# Patient Record
Sex: Male | Born: 1970
Health system: Southern US, Community
[De-identification: ages and names within clinical notes are randomized; demographics above are authoritative.]

## PROBLEM LIST (undated history)

## (undated) DIAGNOSIS — R350 Frequency of micturition: Secondary | ICD-10-CM

## (undated) DIAGNOSIS — I341 Nonrheumatic mitral (valve) prolapse: Secondary | ICD-10-CM

## (undated) DIAGNOSIS — K219 Gastro-esophageal reflux disease without esophagitis: Secondary | ICD-10-CM

## (undated) DIAGNOSIS — K2270 Barrett's esophagus without dysplasia: Secondary | ICD-10-CM

## (undated) DIAGNOSIS — N400 Enlarged prostate without lower urinary tract symptoms: Secondary | ICD-10-CM

## (undated) DIAGNOSIS — R351 Nocturia: Secondary | ICD-10-CM

## (undated) HISTORY — DX: Gastro-esophageal reflux disease without esophagitis: K21.9

## (undated) HISTORY — PX: CHOLECYSTECTOMY: SHX55

## (undated) HISTORY — DX: Benign prostatic hyperplasia without lower urinary tract symptoms: N40.0

## (undated) HISTORY — DX: Frequency of micturition: R35.0

## (undated) HISTORY — DX: Nocturia: R35.1

## (undated) HISTORY — DX: Nonrheumatic mitral (valve) prolapse: I34.1

## (undated) HISTORY — DX: Barrett's esophagus without dysplasia: K22.70

---

## 2004-01-07 ENCOUNTER — Ambulatory Visit: Payer: Self-pay | Admitting: Gastroenterology

## 2004-03-26 ENCOUNTER — Ambulatory Visit: Payer: Self-pay | Admitting: Gastroenterology

## 2004-11-23 ENCOUNTER — Ambulatory Visit: Payer: Self-pay | Admitting: Gastroenterology

## 2006-06-08 ENCOUNTER — Other Ambulatory Visit: Payer: Self-pay

## 2006-06-08 ENCOUNTER — Emergency Department: Payer: Self-pay | Admitting: Unknown Physician Specialty

## 2007-08-24 ENCOUNTER — Ambulatory Visit: Payer: Self-pay | Admitting: Gastroenterology

## 2015-01-07 ENCOUNTER — Encounter: Payer: Self-pay | Admitting: *Deleted

## 2015-01-08 ENCOUNTER — Encounter: Payer: Self-pay | Admitting: Urology

## 2015-01-08 ENCOUNTER — Ambulatory Visit (INDEPENDENT_AMBULATORY_CARE_PROVIDER_SITE_OTHER): Payer: BLUE CROSS/BLUE SHIELD | Admitting: Urology

## 2015-01-08 VITALS — BP 151/79 | HR 83 | Ht 73.0 in | Wt 196.4 lb

## 2015-01-08 DIAGNOSIS — R1909 Other intra-abdominal and pelvic swelling, mass and lump: Secondary | ICD-10-CM | POA: Diagnosis not present

## 2015-01-08 NOTE — Progress Notes (Signed)
01/08/2015 10:28 AM   Jordan Doyle September 06, 1970 [id number]  Referring provider: No referring provider defined for this encounter.  Chief Complaint  Patient presents with  . Lump in groin    HPI: Patient is a 44 year old white male who discovered a lump in his right groin area 1 week ago after carrying down a log home. On the initial discovery was painful. He did not have any change in bowel movements.  He has not had any difficulty urinating. He is sexually active, but he is in a monogamous relationship. He has not had any penile discharge. There has been no fluid collection in the groin mass. He states there has not been any fever or redness with the mass.    Today, he states the lump is not painful. He has not had fevers, chills, nausea or vomiting. He is not having difficulty with urination or any gross hematuria. He also has not had any difficulty with bowel movements or blood in the stool.   PMH: Past Medical History  Diagnosis Date  . Barrett esophagus   . Urinary frequency   . BPH (benign prostatic hyperplasia)   . Nocturia     Surgical History: Past Surgical History  Procedure Laterality Date  . Cholecystectomy      Home Medications:    Medication List       This list is accurate as of: 01/08/15 10:28 AM.  Always use your most recent med list.               lansoprazole 30 MG capsule  Commonly known as:  PREVACID  Take by mouth.        Allergies: No Known Allergies  Family History: Family History  Problem Relation Age of Onset  . Kidney disease Neg Hx   . Prostate cancer Neg Hx     Social History:  reports that he has quit smoking. He does not have any smokeless tobacco history on file. He reports that he drinks alcohol. He reports that he does not use illicit drugs.  ROS: UROLOGY Frequent Urination?: No Hard to postpone urination?: No Burning/pain with urination?: No Get up at night to urinate?: No Leakage of urine?: No Urine stream  starts and stops?: No Trouble starting stream?: No Do you have to strain to urinate?: No Blood in urine?: No Urinary tract infection?: No Sexually transmitted disease?: No Injury to kidneys or bladder?: No Painful intercourse?: No Weak stream?: No Erection problems?: No Penile pain?: No  Gastrointestinal Nausea?: No Vomiting?: No Indigestion/heartburn?: No Diarrhea?: No Constipation?: No  Constitutional Fever: No Night sweats?: No Weight loss?: No Fatigue?: No  Skin Skin rash/lesions?: No Itching?: No  Eyes Blurred vision?: No Double vision?: No  Ears/Nose/Throat Sore throat?: No Sinus problems?: No  Hematologic/Lymphatic Swollen glands?: Yes (?) Easy bruising?: No  Cardiovascular Leg swelling?: No Chest pain?: No  Respiratory Cough?: No Shortness of breath?: No  Endocrine Excessive thirst?: No  Musculoskeletal Back pain?: No Joint pain?: No  Neurological Headaches?: No Dizziness?: No  Psychologic Depression?: No Anxiety?: No  Physical Exam: BP 151/79 mmHg  Pulse 83  Ht  (1.854 m)  Wt 196 lb 6.4 oz (89.086 kg)  BMI 25.92 kg/m2  GU: No CVA tenderness.  No bladder fullness or masses.  Patient with circumcised phallus.   Urethral meatus is patent.  No penile discharge. No penile lesions or rashes. Scrotum without lesions, cysts, rashes and/or edema.  Testicles are located scrotally bilaterally. No masses are appreciated in  the testicles. Left and right epididymis are normal.  A firm nonpainful lump located in the left groin area (10 mm x 40 mm)  Laboratory Data: PSA history  0.9 ng/mL on 03/11/2014   Assessment & Plan:    1. Right groin mass:   Patient with a firm non painful lump in the left groin area.  Worrisome for malignancy.  We will obtain a CT scan of the abdomen and pelvis with contrast.  He will return for the report.    Return for CT scan report.  Michiel CowboySHANNON Rithwik Schmieg, PA-C  [facility] [address], [address] [place], [place] [id number] [phone]

## 2015-01-13 ENCOUNTER — Telehealth: Payer: Self-pay | Admitting: Radiology

## 2015-01-13 ENCOUNTER — Ambulatory Visit (INDEPENDENT_AMBULATORY_CARE_PROVIDER_SITE_OTHER): Payer: BLUE CROSS/BLUE SHIELD | Admitting: Urology

## 2015-01-13 ENCOUNTER — Encounter (HOSPITAL_COMMUNITY): Payer: Self-pay

## 2015-01-13 ENCOUNTER — Encounter: Payer: Self-pay | Admitting: Urology

## 2015-01-13 ENCOUNTER — Ambulatory Visit (HOSPITAL_COMMUNITY)
Admission: RE | Admit: 2015-01-13 | Discharge: 2015-01-13 | Disposition: A | Discharge status: Home / Self Care | Payer: BLUE CROSS/BLUE SHIELD | Source: Ambulatory Visit | Attending: Urology | Admitting: Urology

## 2015-01-13 VITALS — BP 116/71 | HR 80 | Ht 76.0 in | Wt 192.0 lb

## 2015-01-13 DIAGNOSIS — R1909 Other intra-abdominal and pelvic swelling, mass and lump: Secondary | ICD-10-CM

## 2015-01-13 DIAGNOSIS — R591 Generalized enlarged lymph nodes: Secondary | ICD-10-CM | POA: Diagnosis not present

## 2015-01-13 DIAGNOSIS — K439 Ventral hernia without obstruction or gangrene: Secondary | ICD-10-CM | POA: Diagnosis not present

## 2015-01-13 DIAGNOSIS — R590 Localized enlarged lymph nodes: Secondary | ICD-10-CM | POA: Diagnosis not present

## 2015-01-13 MED ORDER — IOHEXOL 300 MG/ML  SOLN
100.0000 mL | Freq: Once | INTRAMUSCULAR | Status: AC | PRN
  Administered 2015-01-13: 100 mL via INTRAVENOUS

## 2015-01-13 NOTE — Telephone Encounter (Signed)
Appt made for 01/13/15 @4 :00. Pt aware.

## 2015-01-13 NOTE — Progress Notes (Signed)
4:51 PM   ETAI COPADO May 07, 1970 [id number]  Referring provider: No referring provider defined for this encounter.  Chief Complaint  Patient presents with  . Results    CT    HPI: Patient is a 44 year old white male who presents today with his wife for his CT scan results.  CT scan was performed for further evaluation of a right groin mass.    Background story Patient is a 44 year old white male who discovered a lump in his right groin area 1 week ago after tearing down a log home. On the initial discovery, the area was painful. He wasn't having any changes in bowel movements.  He did not  any difficulty urinating. He is sexually active, but he is in a monogamous relationship. He did not have any penile discharge. There has been no fluid collection in the groin mass. He states there has not been any fever or redness with the mass.    Today, he states the lump is not painful. He has not had fevers, chills, nausea or vomiting. He is not having difficulty with urination or any gross hematuria. He also has not had any difficulty with bowel movements or blood in the stool.    CT scan noted two enlarged lymph nodes in the right inguinal region.  There are scattered sub centimeter lymph nodes elsewhere in each inguinal region as well as in the mesenteric and retroperitoneal regions.  I  have reviewed the films with the patient and his wife.   PMH: Past Medical History  Diagnosis Date  . Barrett esophagus   . Urinary frequency   . BPH (benign prostatic hyperplasia)   . Nocturia     Surgical History: Past Surgical History  Procedure Laterality Date  . Cholecystectomy      Home Medications:    Medication List       This list is accurate as of: 01/13/15  4:51 PM.  Always use your most recent med list.               lansoprazole 30 MG capsule  Commonly known as:  PREVACID  Take by mouth.        Allergies: No Known Allergies  Family History: Family History    Problem Relation Age of Onset  . Kidney disease Neg Hx   . Prostate cancer Neg Hx     Social History:  reports that he has quit smoking. He does not have any smokeless tobacco history on file. He reports that he drinks alcohol. He reports that he does not use illicit drugs.  ROS: UROLOGY Frequent Urination?: No Hard to postpone urination?: No Burning/pain with urination?: No Get up at night to urinate?: No Leakage of urine?: No Urine stream starts and stops?: No Trouble starting stream?: No Do you have to strain to urinate?: No Blood in urine?: No Urinary tract infection?: No Sexually transmitted disease?: No Injury to kidneys or bladder?: No Painful intercourse?: No Weak stream?: No Erection problems?: No Penile pain?: No  Gastrointestinal Nausea?: No Vomiting?: No Indigestion/heartburn?: No Diarrhea?: No Constipation?: No  Constitutional Fever: No Night sweats?: No Weight loss?: No Fatigue?: No  Skin Skin rash/lesions?: No Itching?: No  Eyes Blurred vision?: No Double vision?: No  Ears/Nose/Throat Sore throat?: No Sinus problems?: No  Hematologic/Lymphatic Swollen glands?: Yes Easy bruising?: No  Cardiovascular Leg swelling?: No Chest pain?: No  Respiratory Cough?: No Shortness of breath?: No  Endocrine Excessive thirst?: No  Musculoskeletal Back pain?: No  Joint pain?: No  Neurological Headaches?: No Dizziness?: No  Psychologic Depression?: No Anxiety?: No  Pertinent imaging CLINICAL DATA: Palpable fullness right inguinal region  EXAM: CT ABDOMEN AND PELVIS WITH CONTRAST  TECHNIQUE: Multidetector CT imaging of the abdomen and pelvis was performed using the standard protocol following bolus administration of intravenous contrast. Oral contrast was also administered.  CONTRAST: 100mL OMNIPAQUE IOHEXOL 300 MG/ML SOLN  COMPARISON: None.  FINDINGS: Lower chest: Lung bases are clear.  Hepatobiliary: No focal  liver lesions are identified. The gallbladder is absent. There is no biliary duct dilatation.  Pancreas: There is no demonstrable pancreatic mass or inflammatory focus.  Spleen: No splenic lesions are identified.  Adrenals/Urinary Tract: No adrenal lesions are identified. There is scarring in the upper pole of the left kidney. There is no renal mass or hydronephrosis on either side. There is no renal or ureteral calculus on either side. The urinary bladder is contracted.  Stomach/Bowel: There is no bowel wall or mesenteric thickening. No bowel obstruction. No free air or portal venous air.  Vascular/Lymphatic: There are enlarged lymph nodes in the right inguinal region. There is a right inguinal lymph node measuring 2.6 x 1.9 cm. A slightly more medial lymph node in the right inguinal region measures 2.5 x 2.1 cm. There is stranding in the immediately surrounding abdominal wall fat in the right anterior inguinal region without similar stranding elsewhere. There are subcentimeter lymph nodes elsewhere in each inguinal region. Elsewhere, there is no appreciable adenopathy in the abdomen or pelvis. There are a few tiny mesenteric retroperitoneal and mesenteric lymph nodes which do not meet size criteria for pathologic significance. There is atherosclerotic calcification in the distal aorta and common iliac arteries. There is no abdominal aortic aneurysm. The major mesenteric arteries are patent.  Reproductive: Prostate and seminal vesicles appear normal.  Other: Appendix appears normal. There is no ascites or abscess in the abdomen or pelvis. No inguinal hernias are apparent. There is a rather minimal ventral hernia at the level of the umbilicus containing only fat.  Musculoskeletal: There is mild degenerative change in the lumbar spine. There are no blastic or lytic bone lesions.  IMPRESSION: There are two enlarged lymph nodes in the right inguinal region. There are  scattered subcentimeter lymph nodes elsewhere in each inguinal region as well as in the mesenteric and retroperitoneal regions. No other lymph node enlargement is appreciable in the abdomen or pelvis. The etiology for this localized right inguinal adenopathy is uncertain. There is mild thickening of the fat adjacent to the inguinal lymph nodes in the right inguinal wall anteriorly. Both inflammatory and neoplastic etiologies could present in this manner.  There is a rather minimal ventral hernia containing only fat.  Study otherwise unremarkable. No liver lesions. Urinary bladder is contracted. This degree of contraction precludes exclusion of potential wall thickness in this area.  There is no bowel wall or mesenteric thickening within the peritoneal or retroperitoneum. No bowel obstruction. Appendix appears normal. No abscess. No renal or ureteral calculus. No hydronephrosis.   Electronically Signed  By: Bretta BangWilliam Woodruff III M.D.  On: 01/13/2015 09:06       Physical Exam: BP 116/71 mmHg  Pulse 80  Ht 6\' 4"  (1.93 m)  Wt 192 lb (87.091 kg)  BMI 23.38 kg/m2  GU: No CVA tenderness.  No bladder fullness or masses.  Patient with circumcised phallus.   Urethral meatus is patent.  No penile discharge. No penile lesions or rashes. Scrotum without lesions, cysts, rashes and/or edema.  Testicles are located scrotally bilaterally. No masses are appreciated in the testicles. Left and right epididymis are normal.  A firm nonpainful lump located in the left groin area (10 mm x 40 mm)  Laboratory Data: PSA history  0.9 ng/mL on 03/11/2014   Assessment & Plan:    1. Right groin mass:   Patient's groin mass is the result of enlarged lymph nodes in the inguinal region not an inguinal hernia.    2. Lymphadenopathy:   CT scan completed on 01/13/2015 noted two enlarged lymph nodes in the right inguinal region with scattered sub centimeter lymph nodes in each inguinal as well as in  the mesenteric and retroperitoneal regions.  I have explained to the patient and his wife that we cannot be certain if this is an inflammatory process or a malignant one.  I will send his urine for culture and GC/Chlamydia cultures.  We will also refer to the Cancer [facility] for further evaluation.    Return for refer to the Cancer center.  Michiel Cowboy, PA-C  [facility] [address] [place], [place] [phone]

## 2015-01-13 NOTE — Telephone Encounter (Signed)
-----   Message from Harle BattiestShannon A McGowan, PA-C sent at 01/13/2015  1:28 PM EST ----- Patient's appointment for his CT results is not until next week.  Would we be able to move him up to this week?

## 2015-01-14 DIAGNOSIS — R591 Generalized enlarged lymph nodes: Secondary | ICD-10-CM | POA: Insufficient documentation

## 2015-01-14 LAB — URINALYSIS, COMPLETE
BILIRUBIN UA: NEGATIVE
GLUCOSE, UA: NEGATIVE
KETONES UA: NEGATIVE
LEUKOCYTES UA: NEGATIVE
Nitrite, UA: NEGATIVE
PROTEIN UA: NEGATIVE
RBC UA: NEGATIVE
SPEC GRAV UA: 1.01 (ref 1.005–1.030)
UUROB: 0.2 mg/dL (ref 0.2–1.0)
pH, UA: 7 (ref 5.0–7.5)

## 2015-01-14 LAB — MICROSCOPIC EXAMINATION
BACTERIA UA: NONE SEEN
Epithelial Cells (non renal): NONE SEEN /hpf (ref 0–10)
RBC MICROSCOPIC, UA: NONE SEEN /HPF (ref 0–?)

## 2015-01-15 ENCOUNTER — Telehealth: Payer: Self-pay

## 2015-01-15 ENCOUNTER — Encounter: Payer: Self-pay | Admitting: Urology

## 2015-01-15 LAB — CULTURE, URINE COMPREHENSIVE

## 2015-01-15 LAB — GC/CHLAMYDIA PROBE AMP
Chlamydia trachomatis, NAA: NEGATIVE
Neisseria gonorrhoeae by PCR: NEGATIVE

## 2015-01-15 NOTE — Telephone Encounter (Signed)
Patient has an appointment on 01/17/2015 at 1:30pm with the [facility].

## 2015-01-15 NOTE — Telephone Encounter (Signed)
Ninfa MeekerHey Shannon     I was just touching base to see if you we able to make an appointment .     Thanks again FriesJoey. Delene Ruffini(Samin)     This is a Wellsite geologistmychart message from pt.

## 2015-01-15 NOTE — Telephone Encounter (Signed)
Sent message to pt via mychart

## 2015-01-17 ENCOUNTER — Encounter: Payer: Self-pay | Admitting: Internal Medicine

## 2015-01-17 ENCOUNTER — Inpatient Hospital Stay: Payer: BLUE CROSS/BLUE SHIELD | Attending: Internal Medicine | Admitting: Internal Medicine

## 2015-01-17 ENCOUNTER — Inpatient Hospital Stay: Payer: BLUE CROSS/BLUE SHIELD

## 2015-01-17 VITALS — BP 109/75 | HR 63 | Temp 96.8°F | Resp 18 | Ht 73.0 in | Wt 190.7 lb

## 2015-01-17 DIAGNOSIS — I341 Nonrheumatic mitral (valve) prolapse: Secondary | ICD-10-CM | POA: Diagnosis not present

## 2015-01-17 DIAGNOSIS — K439 Ventral hernia without obstruction or gangrene: Secondary | ICD-10-CM

## 2015-01-17 DIAGNOSIS — R531 Weakness: Secondary | ICD-10-CM | POA: Diagnosis not present

## 2015-01-17 DIAGNOSIS — R351 Nocturia: Secondary | ICD-10-CM | POA: Insufficient documentation

## 2015-01-17 DIAGNOSIS — N401 Enlarged prostate with lower urinary tract symptoms: Secondary | ICD-10-CM | POA: Diagnosis not present

## 2015-01-17 DIAGNOSIS — R590 Localized enlarged lymph nodes: Secondary | ICD-10-CM | POA: Diagnosis not present

## 2015-01-17 DIAGNOSIS — Z87891 Personal history of nicotine dependence: Secondary | ICD-10-CM | POA: Diagnosis not present

## 2015-01-17 DIAGNOSIS — R591 Generalized enlarged lymph nodes: Secondary | ICD-10-CM

## 2015-01-17 DIAGNOSIS — K219 Gastro-esophageal reflux disease without esophagitis: Secondary | ICD-10-CM | POA: Diagnosis not present

## 2015-01-17 DIAGNOSIS — Z79899 Other long term (current) drug therapy: Secondary | ICD-10-CM | POA: Diagnosis not present

## 2015-01-17 LAB — CBC WITH DIFFERENTIAL/PLATELET
BASOS ABS: 0.1 10*3/uL (ref 0–0.1)
BASOS PCT: 1 %
EOS ABS: 0.1 10*3/uL (ref 0–0.7)
EOS PCT: 2 %
HCT: 45.7 % (ref 40.0–52.0)
Hemoglobin: 15.1 g/dL (ref 13.0–18.0)
LYMPHS PCT: 37 %
Lymphs Abs: 3.1 10*3/uL (ref 1.0–3.6)
MCH: 26.8 pg (ref 26.0–34.0)
MCHC: 32.9 g/dL (ref 32.0–36.0)
MCV: 81.2 fL (ref 80.0–100.0)
Monocytes Absolute: 0.7 10*3/uL (ref 0.2–1.0)
Monocytes Relative: 8 %
Neutro Abs: 4.3 10*3/uL (ref 1.4–6.5)
Neutrophils Relative %: 52 %
PLATELETS: 285 10*3/uL (ref 150–440)
RBC: 5.63 MIL/uL (ref 4.40–5.90)
RDW: 14.1 % (ref 11.5–14.5)
WBC: 8.2 10*3/uL (ref 3.8–10.6)

## 2015-01-17 LAB — COMPREHENSIVE METABOLIC PANEL
ALBUMIN: 4.5 g/dL (ref 3.5–5.0)
ALT: 13 U/L — AB (ref 17–63)
AST: 18 U/L (ref 15–41)
Alkaline Phosphatase: 48 U/L (ref 38–126)
Anion gap: 9 (ref 5–15)
BUN: 11 mg/dL (ref 6–20)
CHLORIDE: 101 mmol/L (ref 101–111)
CO2: 28 mmol/L (ref 22–32)
CREATININE: 0.97 mg/dL (ref 0.61–1.24)
Calcium: 9.3 mg/dL (ref 8.9–10.3)
GFR calc Af Amer: >60 mL/min (ref >60)
GFR calc non Af Amer: >60 mL/min (ref >60)
GLUCOSE: 102 mg/dL — AB (ref 65–99)
POTASSIUM: 4.2 mmol/L (ref 3.5–5.1)
SODIUM: 138 mmol/L (ref 135–145)
Total Bilirubin: 0.6 mg/dL (ref 0.3–1.2)
Total Protein: 7.6 g/dL (ref 6.5–8.1)

## 2015-01-17 LAB — SEDIMENTATION RATE: Sed Rate: 7 mm/hr (ref 0–15)

## 2015-01-17 LAB — LACTATE DEHYDROGENASE: LDH: 162 U/L (ref 98–192)

## 2015-01-17 NOTE — Progress Notes (Signed)
[facility] @ [facility] [phone]  [phone]     GREYSIN MEDLEN OB: Jan 23, 1971  MR#: [id number]  AOZ#:[id number]  Patient Care Team: No Pcp Per Patient as PCP - General (General Practice)  CHIEF COMPLAINT: No chief complaint on file.  Lymphadenopathy  No history exists.    No flowsheet data found.  HISTORY OF PRESENT ILLNESS:    Mr. Spikes is a 44 year old man, who is referred to our clinic for evaluation of right inguinal lymphadenopathy. Mr. Common claims to have had had palpable right inguinal lymphadenopathy since childhood, which did not bother him until approximately 10 days ago, when he noticed irritation, and mild pain. At that time he also was able to feel a second lymph node in the right inguinal area. 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 He was seen by his urologist, who performed a CT of abdomen and pelvis, which revealed presence of 2 lymph nodes in the right inguinal area, less than 3 cm in diameter each. Mr. Stetzer denied dysuria, hematuria, diarrhea, rash, moles, lesions, wounds, other masses or nodules in perianal or scrotal area, as well as along the right leg.  REVIEW OF SYSTEMS:   ROS   PAST MEDICAL HISTORY: Past Medical History  Diagnosis Date  . Barrett esophagus   . Urinary frequency   . BPH (benign prostatic hyperplasia)   . Nocturia   . GERD (gastroesophageal reflux disease)   . Mitral valve prolapse     PAST SURGICAL HISTORY: Past Surgical History  Procedure Laterality Date  . Cholecystectomy      FAMILY HISTORY Family History  Problem Relation Age of Onset  . Kidney disease Neg Hx   . Prostate cancer Neg Hx   . Diabetes Mother   . Deep vein thrombosis Sister   . Pulmonary embolism Sister     ADVANCED DIRECTIVES:  No flowsheet data found.  HEALTH MAINTENANCE: Social History  Substance Use Topics  . Smoking status: Former Games developer  . Smokeless  tobacco: Former Neurosurgeon     Comment: quit 2.5 years  . Alcohol Use: No     No Known Allergies  Current Outpatient Prescriptions  Medication Sig Dispense Refill  . lansoprazole (PREVACID) 30 MG capsule Take by mouth.     No current facility-administered medications for this visit.    OBJECTIVE:  Filed Vitals:   01/17/15 1341  BP: 109/75  Pulse: 63  Temp: 96.8 F (36 C)  Resp: 18     Body mass index is 25.16 kg/(m^2).    ECOG FS:0 - Asymptomatic  Physical Exam  Constitutional: He is oriented to person, place, and time and well-developed, well-nourished, and in no distress. No distress.  Caucasian male  HENT:  Head: Normocephalic and atraumatic.  Right Ear: External ear normal.  Left Ear: External ear normal.  Nose: Nose normal.  Mouth/Throat: Oropharynx is clear and moist. No oropharyngeal exudate.  Eyes: Conjunctivae and EOM are normal. Pupils are equal, round, and reactive to light. Right eye exhibits no discharge. Left eye exhibits no discharge. No scleral icterus.  Neck: Normal range of motion. Neck supple. No JVD present. No tracheal deviation present. No thyromegaly present.  Cardiovascular: Normal rate, regular rhythm, normal heart sounds and intact distal pulses.  Exam reveals no gallop and no friction rub.   No murmur heard. Pulmonary/Chest: Effort normal and breath sounds normal. No stridor. No respiratory distress. He has no wheezes. He has  no rales. He exhibits no tenderness.  Abdominal: Soft. Bowel sounds are normal. He exhibits no distension and no mass. There is no tenderness. There is no rebound and no guarding.  Genitourinary: Penis normal. Rectal exam shows no external hemorrhoid, no fissure, no laceration, no mass and no tenderness. He exhibits no abnormal testicular mass, no testicular tenderness, no abnormal scrotal mass, no scrotal tenderness and no epididymal tenderness. Cremasteric reflex is present. Penis exhibits no lesions and no edema. No discharge  found.  Musculoskeletal: Normal range of motion. He exhibits no edema or tenderness.  Lymphadenopathy:       Head (right side): No submental, no submandibular, no tonsillar, no preauricular, no posterior auricular and no occipital adenopathy present.       Head (left side): No submental, no submandibular, no tonsillar, no preauricular, no posterior auricular and no occipital adenopathy present.    He has no cervical adenopathy.       Right cervical: No superficial cervical, no deep cervical and no posterior cervical adenopathy present.      Left cervical: No superficial cervical, no deep cervical and no posterior cervical adenopathy present.    He has no axillary adenopathy.       Right axillary: No pectoral and no lateral adenopathy present.       Left axillary: No pectoral and no lateral adenopathy present.      Right: Inguinal (Firm, mobile, nontender, 2 palpable lymph nodes, approximately 2 cm in diameter) adenopathy present. No supraclavicular and no epitrochlear adenopathy present.       Left: No inguinal, no supraclavicular and no epitrochlear adenopathy present.  Neurological: He is alert and oriented to person, place, and time. He has normal reflexes. No cranial nerve deficit. He exhibits normal muscle tone. Gait normal. Coordination normal. GCS score is 15.  Skin: Skin is warm and dry. No rash noted. He is not diaphoretic. No erythema. No pallor.  Psychiatric: Mood, memory, affect and judgment normal.  Nursing note and vitals reviewed.    LAB RESULTS:  No flowsheet data found.  Office Visit on 01/13/2015  Component Date Value Ref Range Status  . Specific Gravity, UA 01/13/2015 1.010  1.005 - 1.030 Final  . pH, UA 01/13/2015 7.0  5.0 - 7.5 Final  . Color, UA 01/13/2015 Yellow  Yellow Final  . Appearance Ur 01/13/2015 Clear  Clear Final  . Leukocytes, UA 01/13/2015 Negative  Negative Final  . Protein, UA 01/13/2015 Negative  Negative/Trace Final  . Glucose, UA 01/13/2015  Negative  Negative Final  . Ketones, UA 01/13/2015 Negative  Negative Final  . RBC, UA 01/13/2015 Negative  Negative Final  . Bilirubin, UA 01/13/2015 Negative  Negative Final  . Urobilinogen, Ur 01/13/2015 0.2  0.2 - 1.0 mg/dL Final  . Nitrite, UA 16/10/960412/06/2014 Negative  Negative Final  . Microscopic Examination 01/13/2015 See below:   Final  . Urine Culture, Comprehensive 01/13/2015 Final report   Final  . Result 1 01/13/2015 Comment   Final   No growth in 36 - 48 hours.  . Chlamydia trachomatis, NAA 01/13/2015 Negative  Negative Final  . Neisseria gonorrhoeae by PCR 01/13/2015 Negative  Negative Final  . WBC, UA 01/13/2015 0-5  0 -  5 /hpf Final  . RBC, UA 01/13/2015 None seen  0 -  2 /hpf Final  . Epithelial Cells (non renal) 01/13/2015 None seen  0 - 10 /hpf Final  . Bacteria, UA 01/13/2015 None seen  None seen/Few Final     STUDIES:  Ct Abdomen Pelvis W Contrast  01/13/2015  CLINICAL DATA:  Palpable fullness right inguinal region EXAM: CT ABDOMEN AND PELVIS WITH CONTRAST TECHNIQUE: Multidetector CT imaging of the abdomen and pelvis was performed using the standard protocol following bolus administration of intravenous contrast. Oral contrast was also administered. CONTRAST:  OMNIPAQUE IOHEXOL 300 MG/ML  SOLN COMPARISON:  None. FINDINGS: Lower chest:  Lung bases are clear. Hepatobiliary: No focal liver lesions are identified. The gallbladder is absent. There is no biliary duct dilatation. Pancreas: There is no demonstrable pancreatic mass or inflammatory focus. Spleen: No splenic lesions are identified. Adrenals/Urinary Tract: No adrenal lesions are identified. There is scarring in the upper pole of the left kidney. There is no renal mass or hydronephrosis on either side. There is no renal or ureteral calculus on either side. The urinary bladder is contracted. Stomach/Bowel: There is no bowel wall or mesenteric thickening. No bowel obstruction. No free air or portal venous air.  Vascular/Lymphatic: There are enlarged lymph nodes in the right inguinal region. There is a right inguinal lymph node measuring 2.6 x 1.9 cm. A slightly more medial lymph node in the right inguinal region measures 2.5 x 2.1 cm. There is stranding in the immediately surrounding abdominal wall fat in the right anterior inguinal region without similar stranding elsewhere. There are subcentimeter lymph nodes elsewhere in each inguinal region. Elsewhere, there is no appreciable adenopathy in the abdomen or pelvis. There are a few tiny mesenteric retroperitoneal and mesenteric lymph nodes which do not meet size criteria for pathologic significance. There is atherosclerotic calcification in the distal aorta and common iliac arteries. There is no abdominal aortic aneurysm. The major mesenteric arteries are patent. Reproductive: Prostate and seminal vesicles appear normal. Other: Appendix appears normal. There is no ascites or abscess in the abdomen or pelvis. No inguinal hernias are apparent. There is a rather minimal ventral hernia at the level of the umbilicus containing only fat. Musculoskeletal: There is mild degenerative change in the lumbar spine. There are no blastic or lytic bone lesions. IMPRESSION: There are two enlarged lymph nodes in the right inguinal region. There are scattered subcentimeter lymph nodes elsewhere in each inguinal region as well as in the mesenteric and retroperitoneal regions. No other lymph node enlargement is appreciable in the abdomen or pelvis. The etiology for this localized right inguinal adenopathy is uncertain. There is mild thickening of the fat adjacent to the inguinal lymph nodes in the right inguinal wall anteriorly. Both inflammatory and neoplastic etiologies could present in this manner. There is a rather minimal ventral hernia containing only fat. Study otherwise unremarkable. No liver lesions. Urinary bladder is contracted. This degree of contraction precludes exclusion of  potential wall thickness in this area. There is no bowel wall or mesenteric thickening within the peritoneal or retroperitoneum. No bowel obstruction. Appendix appears normal. No abscess. No renal or ureteral calculus. No hydronephrosis. Electronically Signed   By: Bretta Bang III M.D.   On: 01/13/2015 09:06    ASSESSMENT: Lymphadenopathy- Mr. Correia provided [place] with a history of long-standing mild right inguinal lymphadenopathy. He noticed the second lymph node in early December 2016, and this discovery was associated with mild discomfort, which has since resolved. I personally reviewed the CT images of the abdomen and pelvis, and there was no significant lymphadenopathy noticed. Physical exam does not suggest the presence of palpable lymphadenopathy in other areas, and there are no wounds, moles or rash in adjacent areas. The palpable lymph nodes are nontender, and he  has no systemic symptoms. In the absence of rapid increase in size of these nodes, it appears reasonable to watch Mr. Alexopoulos clinical status over the next 2 weeks. Prior to his return to our clinic he will have an ultrasound of the right inguinal area, which hopefully will be able to visualize the superficial lymph nodes, and measure the size. If there is no increase in size of the lymph nodes, we may choose to monitor his clinical status expectantly. If there is concern about increase in size of the lymph nodes, we will consider core biopsy of the largest node. We will check CBC, CMP, LDH, inflammatory markers.  MEDICAL DECISION MAKING:   Patient expressed understanding and was in agreement with this plan. He also understands that He can call clinic at any time with any questions, concerns, or complaints.    No matching staging information was found for the patient.  Gorden Harms, MD   01/17/2015 1:16 PM

## 2015-01-17 NOTE — Progress Notes (Signed)
Pt states for as long as he can remember he has had a lump in right groin.  He did feel another lump and discomfort in right groin shortly after working on taking down log cabin.  It was so sore she could not wear tool belt for a week.  When he went back to see shannon last week, it was not sore any more. He also had a finger the same day that was red and swollen and it opened up and drained pus and not it is all better. He did not know of any fever since he had the lump come up.

## 2015-01-18 LAB — C-REACTIVE PROTEIN

## 2015-01-20 ENCOUNTER — Ambulatory Visit: Payer: BLUE CROSS/BLUE SHIELD | Admitting: Urology

## 2015-01-27 ENCOUNTER — Inpatient Hospital Stay (HOSPITAL_BASED_OUTPATIENT_CLINIC_OR_DEPARTMENT_OTHER): Payer: BLUE CROSS/BLUE SHIELD | Admitting: Internal Medicine

## 2015-01-27 ENCOUNTER — Ambulatory Visit
Admission: RE | Admit: 2015-01-27 | Discharge: 2015-01-27 | Disposition: A | Discharge status: Home / Self Care | Payer: BLUE CROSS/BLUE SHIELD | Source: Ambulatory Visit | Attending: Internal Medicine | Admitting: Internal Medicine

## 2015-01-27 VITALS — BP 131/84 | HR 60 | Temp 97.1°F | Resp 18 | Ht 73.0 in | Wt 194.7 lb

## 2015-01-27 DIAGNOSIS — R351 Nocturia: Secondary | ICD-10-CM

## 2015-01-27 DIAGNOSIS — R591 Generalized enlarged lymph nodes: Secondary | ICD-10-CM | POA: Diagnosis not present

## 2015-01-27 DIAGNOSIS — N401 Enlarged prostate with lower urinary tract symptoms: Secondary | ICD-10-CM

## 2015-01-27 DIAGNOSIS — I341 Nonrheumatic mitral (valve) prolapse: Secondary | ICD-10-CM

## 2015-01-27 DIAGNOSIS — R590 Localized enlarged lymph nodes: Secondary | ICD-10-CM | POA: Diagnosis not present

## 2015-01-27 DIAGNOSIS — K439 Ventral hernia without obstruction or gangrene: Secondary | ICD-10-CM | POA: Diagnosis not present

## 2015-01-27 DIAGNOSIS — K219 Gastro-esophageal reflux disease without esophagitis: Secondary | ICD-10-CM

## 2015-01-27 DIAGNOSIS — Z87891 Personal history of nicotine dependence: Secondary | ICD-10-CM

## 2015-01-27 DIAGNOSIS — Z79899 Other long term (current) drug therapy: Secondary | ICD-10-CM

## 2015-01-27 NOTE — Progress Notes (Signed)
Jordan Doyle @ [facility] [phone]  [phone]     Jordan Doyle OB: 07-09-1970  MR#: [id number]  HYI#:[id number]  Patient Care Team: Pcp Not In System as PCP - General  CHIEF COMPLAINT:  Chief Complaint  Patient presents with  . Lymphadenopathy    [place] and lab results follow-up   Lymphadenopathy  No history exists.    No flowsheet data found.  HISTORY OF PRESENT ILLNESS:    Jordan Doyle is a 44 year old man, who is referred to our clinic for evaluation of right inguinal lymphadenopathy. Jordan Doyle claims to have had had palpable right inguinal lymphadenopathy since childhood, which did not bother him until approximately 10 days ago, when he noticed irritation, and mild pain. At that time he also was able to feel a second lymph node in the right inguinal area. 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 He was seen by his urologist, who performed a CT of abdomen and pelvis, which revealed presence of 2 lymph nodes in the right inguinal area, less than 3 cm in diameter each.  Current status: Jordan Doyle returns to our clinic for a follow-up visit. Since his last appointment he noticed decrease in size of the right inguinal lymphadenopathy. Jordan Doyle denied dysuria, hematuria, diarrhea, rash, moles, lesions, wounds, other masses or nodules in perianal or scrotal area, as well as along the right leg.  REVIEW OF SYSTEMS:   ROS   PAST MEDICAL HISTORY: Past Medical History  Diagnosis Date  . Barrett esophagus   . Urinary frequency   . BPH (benign prostatic hyperplasia)   . Nocturia   . GERD (gastroesophageal reflux disease)   . Mitral valve prolapse     PAST SURGICAL HISTORY: Past Surgical History  Procedure Laterality Date  . Cholecystectomy      FAMILY HISTORY Family History  Problem Relation Age of Onset  . Kidney disease Neg Hx   . Prostate cancer Neg Hx   . Diabetes Mother   . Deep vein  thrombosis Sister   . Pulmonary embolism Sister     ADVANCED DIRECTIVES:  No flowsheet data found.  HEALTH MAINTENANCE: Social History  Substance Use Topics  . Smoking status: Former Research scientist (life sciences)  . Smokeless tobacco: Former Systems developer     Comment: quit 2.5 years  . Alcohol Use: No     No Known Allergies  Current Outpatient Prescriptions  Medication Sig Dispense Refill  . lansoprazole (PREVACID) 30 MG capsule Take by mouth.     No current facility-administered medications for this visit.    OBJECTIVE:  Filed Vitals:   01/27/15 1506  BP: 131/84  Pulse: 60  Temp: 97.1 F (36.2 C)  Resp: 18     Body mass index is 25.69 kg/(m^2).    ECOG FS:0 - Asymptomatic  Physical Exam  Constitutional: He is oriented to person, place, and time and well-developed, well-nourished, and in no distress. No distress.  Caucasian male  HENT:  Head: Normocephalic and atraumatic.  Right Ear: External ear normal.  Left Ear: External ear normal.  Nose: Nose normal.  Mouth/Throat: Oropharynx is clear and moist. No oropharyngeal exudate.  Eyes: Conjunctivae and EOM are normal. Pupils are equal, round, and reactive to light. Right eye exhibits no discharge. Left eye exhibits no discharge. No scleral icterus.  Neck: Normal range of motion. Neck supple. No JVD present. No tracheal deviation present. No thyromegaly present.  Cardiovascular: Normal rate, regular rhythm,  normal heart sounds and intact distal pulses.  Exam reveals no gallop and no friction rub.   No murmur heard. Pulmonary/Chest: Effort normal and breath sounds normal. No stridor. No respiratory distress. He has no wheezes. He has no rales. He exhibits no tenderness.  Abdominal: Soft. Bowel sounds are normal. He exhibits no distension and no mass. There is no tenderness. There is no rebound and no guarding.  Genitourinary: Penis normal. Rectal exam shows no external hemorrhoid, no fissure, no laceration, no mass and no tenderness. He exhibits no  abnormal testicular mass, no testicular tenderness, no abnormal scrotal mass, no scrotal tenderness and no epididymal tenderness. Cremasteric reflex is present. Penis exhibits no lesions and no edema. No discharge found.  Musculoskeletal: Normal range of motion. He exhibits no edema or tenderness.  Lymphadenopathy:       Head (right side): No submental, no submandibular, no tonsillar, no preauricular, no posterior auricular and no occipital adenopathy present.       Head (left side): No submental, no submandibular, no tonsillar, no preauricular, no posterior auricular and no occipital adenopathy present.    He has no cervical adenopathy.       Right cervical: No superficial cervical, no deep cervical and no posterior cervical adenopathy present.      Left cervical: No superficial cervical, no deep cervical and no posterior cervical adenopathy present.    He has no axillary adenopathy.       Right axillary: No pectoral and no lateral adenopathy present.       Left axillary: No pectoral and no lateral adenopathy present.      Right: Inguinal (Firm, mobile, nontender, 2 palpable lymph nodes, approximately 2 cm in diameter) adenopathy present. No supraclavicular and no epitrochlear adenopathy present.       Left: No inguinal, no supraclavicular and no epitrochlear adenopathy present.  Neurological: He is alert and oriented to person, place, and time. He has normal reflexes. No cranial nerve deficit. He exhibits normal muscle tone. Gait normal. Coordination normal. GCS score is 15.  Skin: Skin is warm and dry. No rash noted. He is not diaphoretic. No erythema. No pallor.  Psychiatric: Mood, memory, affect and judgment normal.  Nursing note and vitals reviewed.    LAB RESULTS:  CBC Latest Ref Rng 01/17/2015  WBC 3.8 - 10.6 K/uL 8.2  Hemoglobin 13.0 - 18.0 g/dL 15.1  Hematocrit 40.0 - 52.0 % 45.7  Platelets 150 - 440 K/uL 285    No visits with results within 5 Day(s) from this visit. Latest  known visit with results is:  Appointment on 01/17/2015  Component Date Value Ref Range Status  . WBC 01/17/2015 8.2  3.8 - 10.6 K/uL Final  . RBC 01/17/2015 5.63  4.40 - 5.90 MIL/uL Final  . Hemoglobin 01/17/2015 15.1  13.0 - 18.0 g/dL Final  . HCT 01/17/2015 45.7  40.0 - 52.0 % Final  . MCV 01/17/2015 81.2  80.0 - 100.0 fL Final  . MCH 01/17/2015 26.8  26.0 - 34.0 pg Final  . MCHC 01/17/2015 32.9  32.0 - 36.0 g/dL Final  . RDW 01/17/2015 14.1  11.5 - 14.5 % Final  . Platelets 01/17/2015 285  150 - 440 K/uL Final  . Neutrophils Relative % 01/17/2015 52   Final  . Neutro Abs 01/17/2015 4.3  1.4 - 6.5 K/uL Final  . Lymphocytes Relative 01/17/2015 37   Final  . Lymphs Abs 01/17/2015 3.1  1.0 - 3.6 K/uL Final  . Monocytes Relative 01/17/2015 8  Final  . Monocytes Absolute 01/17/2015 0.7  0.2 - 1.0 K/uL Final  . Eosinophils Relative 01/17/2015 2   Final  . Eosinophils Absolute 01/17/2015 0.1  0 - 0.7 K/uL Final  . Basophils Relative 01/17/2015 1   Final  . Basophils Absolute 01/17/2015 0.1  0 - 0.1 K/uL Final  . Sodium 01/17/2015 138  135 - 145 mmol/L Final  . Potassium 01/17/2015 4.2  3.5 - 5.1 mmol/L Final  . Chloride 01/17/2015 101  101 - 111 mmol/L Final  . CO2 01/17/2015 28  22 - 32 mmol/L Final  . Glucose, Bld 01/17/2015 102* 65 - 99 mg/dL Final  . BUN 01/17/2015 11  6 - 20 mg/dL Final  . Creatinine, Ser 01/17/2015 0.97  0.61 - 1.24 mg/dL Final  . Calcium 01/17/2015 9.3  8.9 - 10.3 mg/dL Final  . Total Protein 01/17/2015 7.6  6.5 - 8.1 g/dL Final  . Albumin 01/17/2015 4.5  3.5 - 5.0 g/dL Final  . AST 01/17/2015 18  15 - 41 U/L Final  . ALT 01/17/2015 13* 17 - 63 U/L Final  . Alkaline Phosphatase 01/17/2015 48  38 - 126 U/L Final  . Total Bilirubin 01/17/2015 0.6  0.3 - 1.2 mg/dL Final  . GFR calc non Af Amer 01/17/2015 >60  >60 mL/min Final  . GFR calc Af Amer 01/17/2015 >60  >60 mL/min Final   Comment: (NOTE) The eGFR has been calculated using the CKD EPI equation. This  calculation has not been validated in all clinical situations. eGFR's persistently <60 mL/min signify possible Chronic Kidney Disease.   . Anion gap 01/17/2015 9  5 - 15 Final  . LDH 01/17/2015 162  98 - 192 U/L Final  . Sed Rate 01/17/2015 7  0 - 15 mm/hr Final  . CRP 01/17/2015 <0.5  <1.0 mg/dL Final   Performed at [facility]     STUDIES: Ct Abdomen Pelvis W Contrast  01/13/2015  CLINICAL DATA:  Palpable fullness right inguinal region EXAM: CT ABDOMEN AND PELVIS WITH CONTRAST TECHNIQUE: Multidetector CT imaging of the abdomen and pelvis was performed using the standard protocol following bolus administration of intravenous contrast. Oral contrast was also administered. CONTRAST:  162m OMNIPAQUE IOHEXOL 300 MG/ML  SOLN COMPARISON:  None. FINDINGS: Lower chest:  Lung bases are clear. Hepatobiliary: No focal liver lesions are identified. The gallbladder is absent. There is no biliary duct dilatation. Pancreas: There is no demonstrable pancreatic mass or inflammatory focus. Spleen: No splenic lesions are identified. Adrenals/Urinary Tract: No adrenal lesions are identified. There is scarring in the upper pole of the left kidney. There is no renal mass or hydronephrosis on either side. There is no renal or ureteral calculus on either side. The urinary bladder is contracted. Stomach/Bowel: There is no bowel wall or mesenteric thickening. No bowel obstruction. No free air or portal venous air. Vascular/Lymphatic: There are enlarged lymph nodes in the right inguinal region. There is a right inguinal lymph node measuring 2.6 x 1.9 cm. A slightly more medial lymph node in the right inguinal region measures 2.5 x 2.1 cm. There is stranding in the immediately surrounding abdominal wall fat in the right anterior inguinal region without similar stranding elsewhere. There are subcentimeter lymph nodes elsewhere in each inguinal region. Elsewhere, there is no appreciable adenopathy in the abdomen or  pelvis. There are a few tiny mesenteric retroperitoneal and mesenteric lymph nodes which do not meet size criteria for pathologic significance. There is atherosclerotic calcification in the distal aorta  and common iliac arteries. There is no abdominal aortic aneurysm. The major mesenteric arteries are patent. Reproductive: Prostate and seminal vesicles appear normal. Other: Appendix appears normal. There is no ascites or abscess in the abdomen or pelvis. No inguinal hernias are apparent. There is a rather minimal ventral hernia at the level of the umbilicus containing only fat. Musculoskeletal: There is mild degenerative change in the lumbar spine. There are no blastic or lytic bone lesions. IMPRESSION: There are two enlarged lymph nodes in the right inguinal region. There are scattered subcentimeter lymph nodes elsewhere in each inguinal region as well as in the mesenteric and retroperitoneal regions. No other lymph node enlargement is appreciable in the abdomen or pelvis. The etiology for this localized right inguinal adenopathy is uncertain. There is mild thickening of the fat adjacent to the inguinal lymph nodes in the right inguinal wall anteriorly. Both inflammatory and neoplastic etiologies could present in this manner. There is a rather minimal ventral hernia containing only fat. Study otherwise unremarkable. No liver lesions. Urinary bladder is contracted. This degree of contraction precludes exclusion of potential wall thickness in this area. There is no bowel wall or mesenteric thickening within the peritoneal or retroperitoneum. No bowel obstruction. Appendix appears normal. No abscess. No renal or ureteral calculus. No hydronephrosis. Electronically Signed   By: Lowella Grip III M.D.   On: 01/13/2015 09:06   [place] Extrem Low Right Ltd  01/27/2015  CLINICAL DATA:  Right groin lymphadenopathy noted prior CT scan. Right groin swelling for the past 3 weeks. EXAM: ULTRASOUND RIGHT LOWER EXTREMITY  LIMITED TECHNIQUE: Ultrasound examination of the lower extremity soft tissues was performed in the area of clinical concern. COMPARISON:  CT abdomen pelvis - 01/13/2015 FINDINGS: Note is made of several borderline enlarged right inguinal lymph nodes which correlate with the findings seen on preceding abdominal CT. Dominant right inguinal lymph node measures approximately 1.4 cm in greatest short axis diameter though appears to maintain a benign fatty hilum (image 6). Additional adjacent right inguinal lymph nodes are individually not enlarged by size criteria with dominant node measuring approximately 1 cm in greatest short axis diameter (image 17) and additional node measuring approximately 0.8 cm in greatest short axis diameter (note, the performing sonographer measured both of these adjacent nodes as a solitary inguinal node lymph node). Both of these adjacent nodules maintain benign fatty hila. IMPRESSION: Nonspecific borderline enlarged right inguinal lymph nodes with dominant lymph node measuring 1.4 cm in greatest short axis diameter (borderline enlarged) though maintains a benign fatty hila, morphologically similar compared to the 01/13/2015 examination. Differential considerations remain broad and include infectious, inflammatory and malignant etiologies (such as lymphoma). Clinical correlation is advised. If right inguinal adenopathy persists for greater than 4-6 weeks, further evaluation with ultrasound-guided right inguinal lymph node biopsy could be performed as indicated. Electronically Signed   By: Sandi Mariscal M.D.   On: 01/27/2015 13:53    ASSESSMENT and Plan: Lymphadenopathy- Jordan Doyle provided [place] with a history of long-standing mild right inguinal lymphadenopathy. Since his last appointment the size of lymph node has decreased. Ultrasound of the right inguinal lymph nodes showed no concerning findings, with decreased size of lymph nodes in preserved normal architecture. Blood work review did  not show any evidence suspicious for malignant hematologic condition. At this time we will not continue to follow-up with Jordan Doyle, however, he will monitor right inguinal lymphadenopathy, and if it were to increase in size, he would contact us immediately, and we would arrange core  biopsy or excisional biopsy.    Patient expressed understanding and was in agreement with this plan. He also understands that He can call clinic at any time with any questions, concerns, or complaints.    No matching staging information was found for the patient.  Roxana Hires, MD   01/27/2015 3:31 PM

## 2015-02-17 ENCOUNTER — Ambulatory Visit: Payer: BLUE CROSS/BLUE SHIELD | Admitting: Urology

## 2016-01-30 IMAGING — US US EXTREM LOW*R* LIMITED
1 series · 13 of 19 positions shown · non-contrast
Comparison: CT abdomen pelvis - 01/13/2015

CLINICAL DATA: Right groin lymphadenopathy noted prior CT scan.
Right groin swelling for the past 3 weeks.

EXAM:
ULTRASOUND RIGHT LOWER EXTREMITY LIMITED
TECHNIQUE: Ultrasound examination of the lower extremity soft tissues was
performed in the area of clinical concern.

[Series 1: us extrem low*right* limited · 0.06mm/px · 19 acquisitions, 13 frames shown]
[im 1/19]
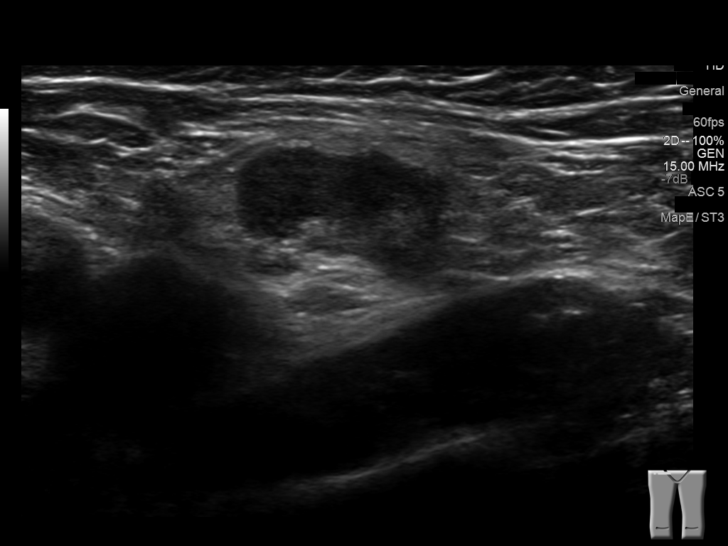
[im 3/19]
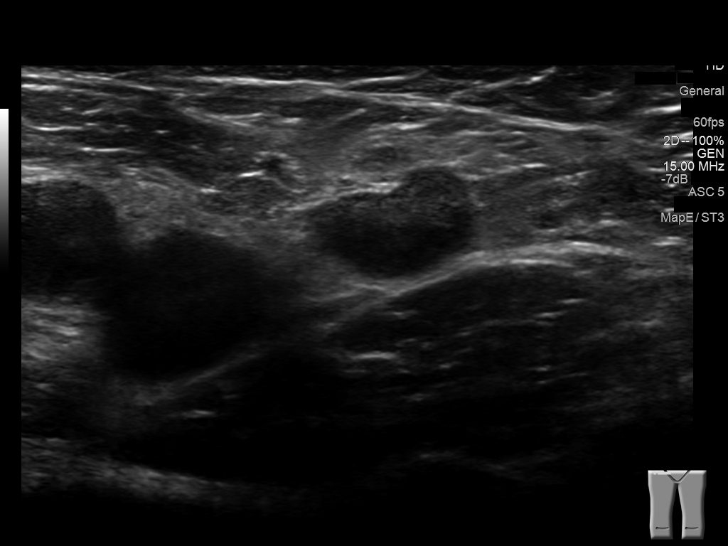
[im 4/19]
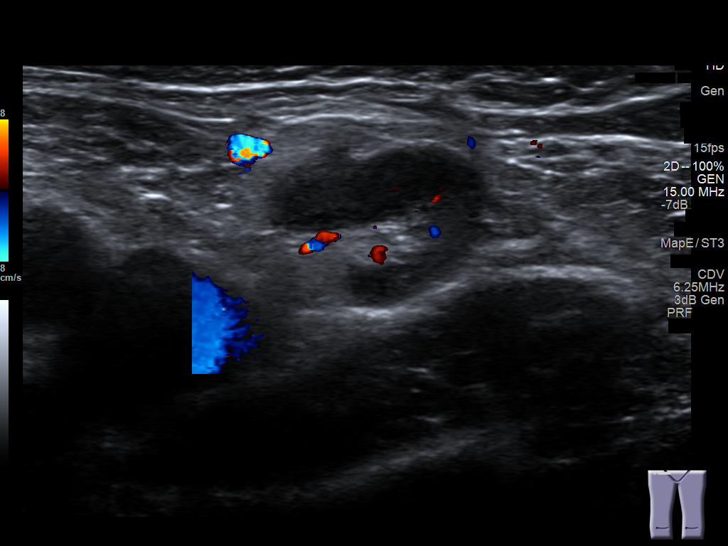
[im 6/19]
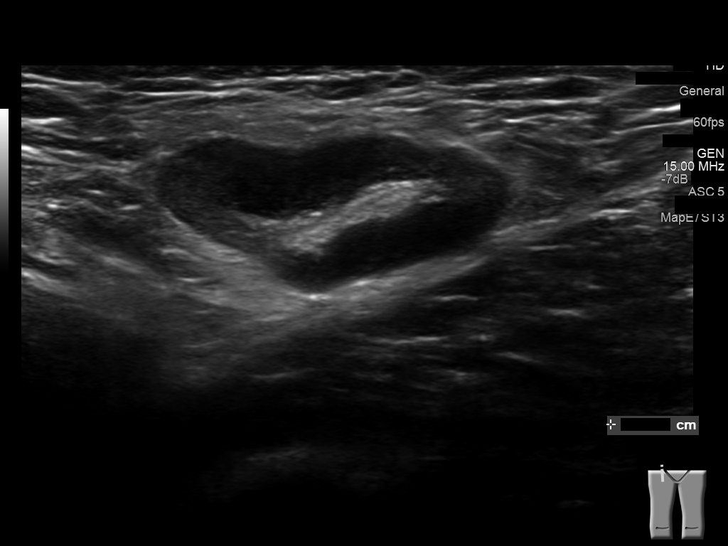
[im 7/19]
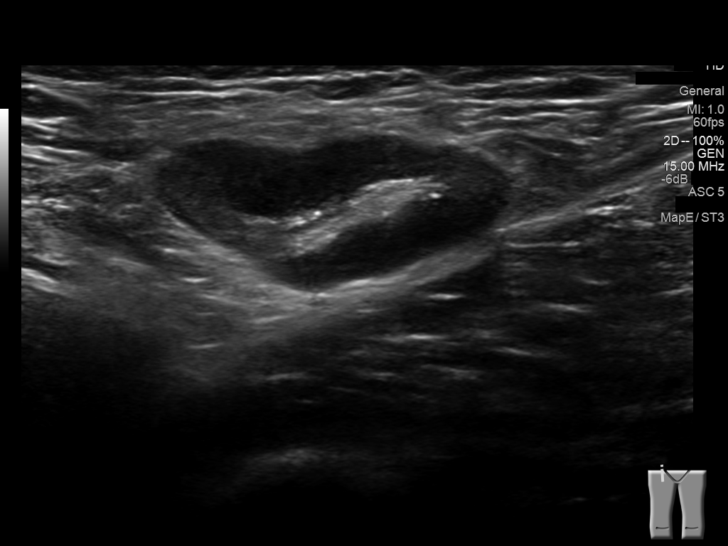
[im 9/19]
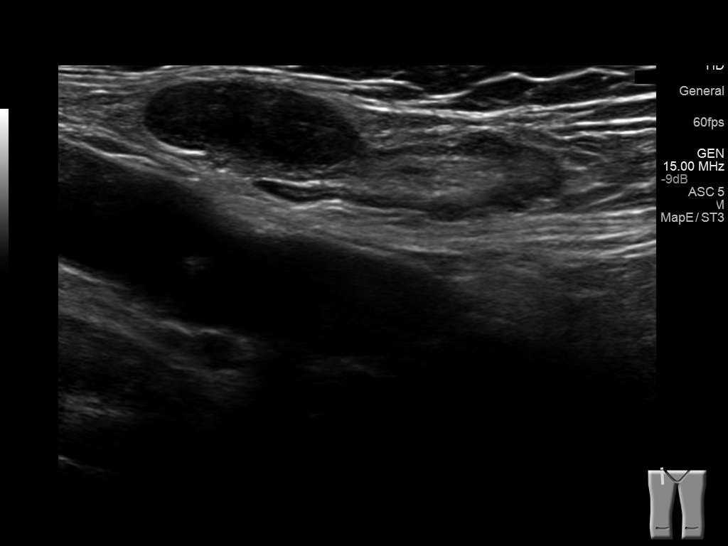
[im 10/19]
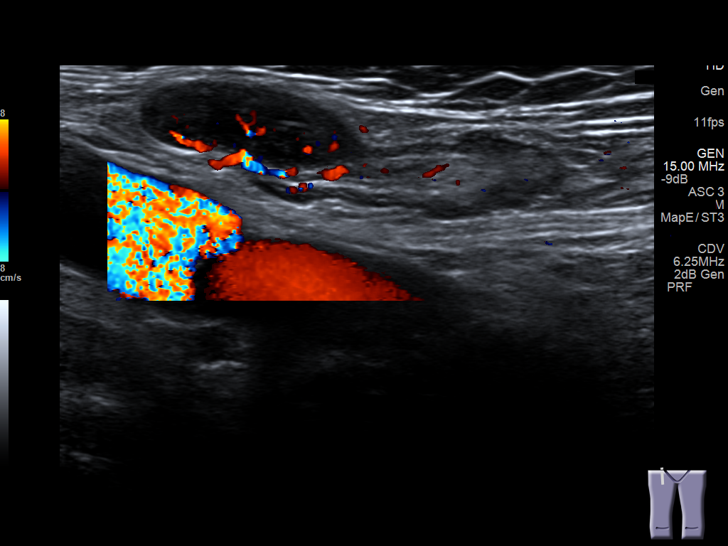
[im 11/19]
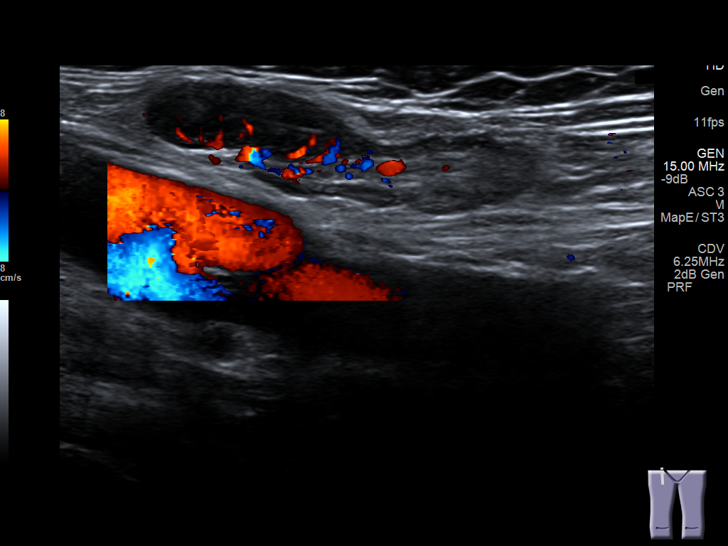
[im 13/19]
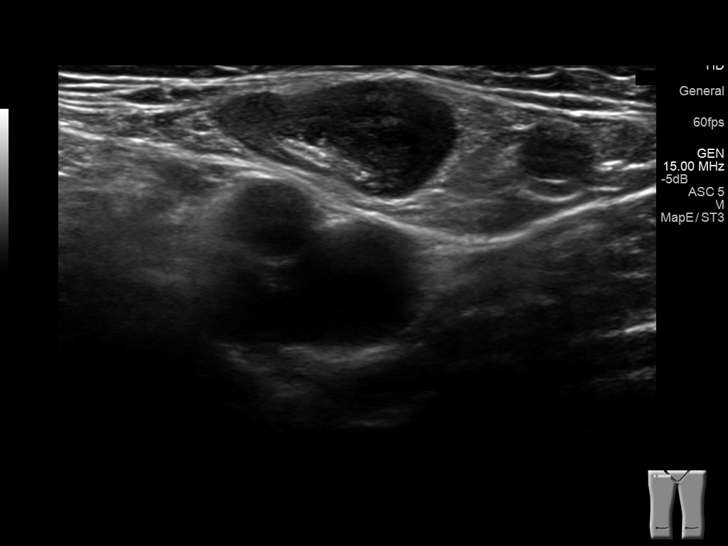
[im 14/19]
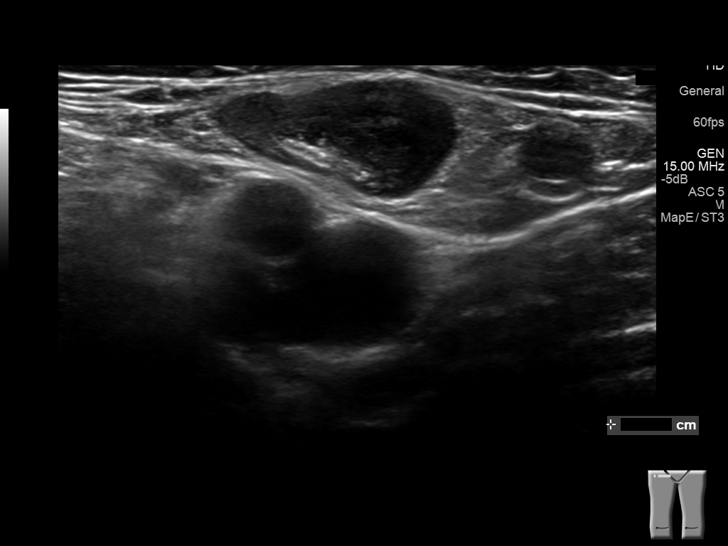
[im 16/19]
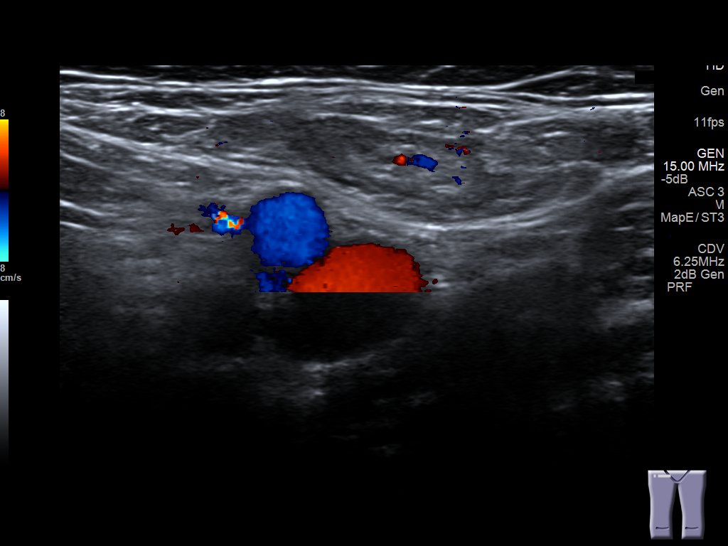
[im 17/19]
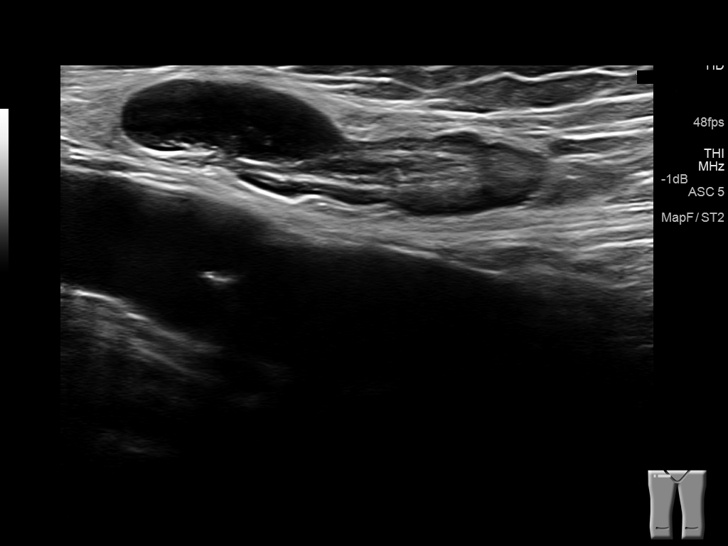
[im 19/19]
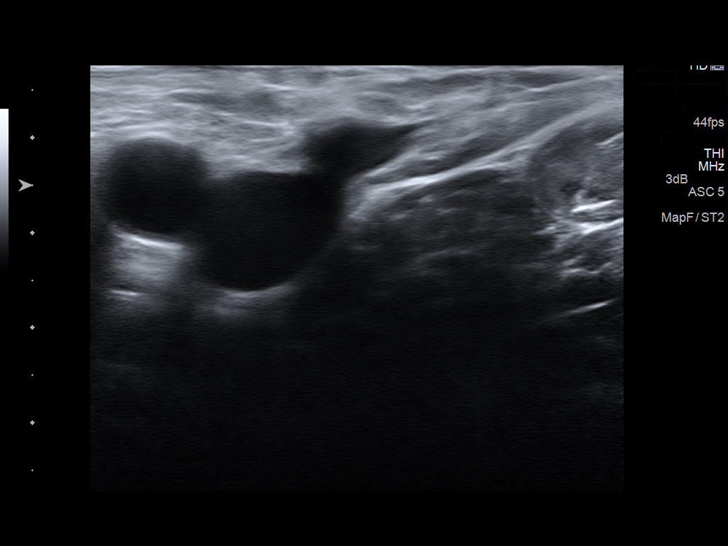

[13 of 19 positions shown; findings below may reference images not displayed]

FINDINGS: Note is made of several borderline enlarged right inguinal lymph
nodes which correlate with the findings seen on preceding abdominal
CT.

Dominant right inguinal lymph node measures approximately 1.4 cm in
greatest short axis diameter though appears to maintain a benign
fatty hilum (image 6).

Additional adjacent right inguinal lymph nodes are individually not
enlarged by size criteria with dominant node measuring approximately
1 cm in greatest short axis diameter (image 17) and additional node
measuring approximately 0.8 cm in greatest short axis diameter
(note, the performing sonographer measured both of these adjacent
nodes as a solitary inguinal node lymph node). Both of these
adjacent nodules maintain benign fatty hila.
IMPRESSION: Nonspecific borderline enlarged right inguinal lymph nodes with
dominant lymph node measuring 1.4 cm in greatest short axis diameter
(borderline enlarged) though maintains a benign fatty hila,
morphologically similar compared to the 01/13/2015 examination.
Differential considerations remain broad and include infectious,
inflammatory and malignant etiologies (such as lymphoma). Clinical
correlation is advised. If right inguinal adenopathy persists for
greater than 4-6 weeks, further evaluation with ultrasound-guided
right inguinal lymph node biopsy could be performed as indicated.

## 2016-03-06 IMAGING — CT CT ABD-PELV W/ CM
2 of 5 series · 14 of 46 positions shown, 16 images · IV contrast (Omni 300)
Comparison: None.

CLINICAL DATA: Palpable fullness right inguinal region

EXAM:
CT ABDOMEN AND PELVIS WITH CONTRAST
TECHNIQUE: Multidetector CT imaging of the abdomen and pelvis was performed
using the standard protocol following bolus administration of
intravenous contrast. Oral contrast was also administered.
CONTRAST:  100mL OMNIPAQUE IOHEXOL 300 MG/ML  SOLN

[Series 2: a/p w/ 5mm · axial · 0.80mm/px · z∈[-1053,-608]mm · 11 of 99 slices shown, 13 images]
[im 5/99  soft-tissue]
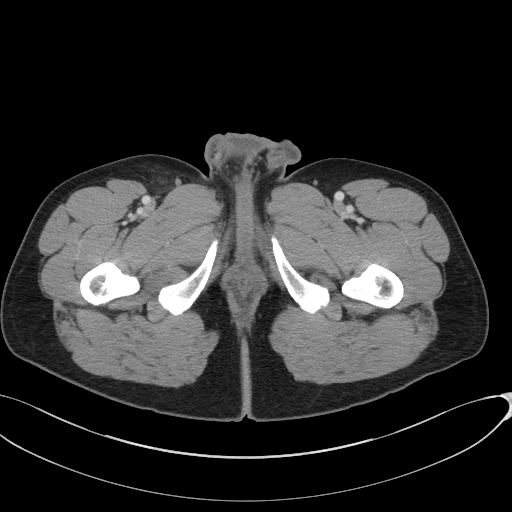
[im 5/99  bone]
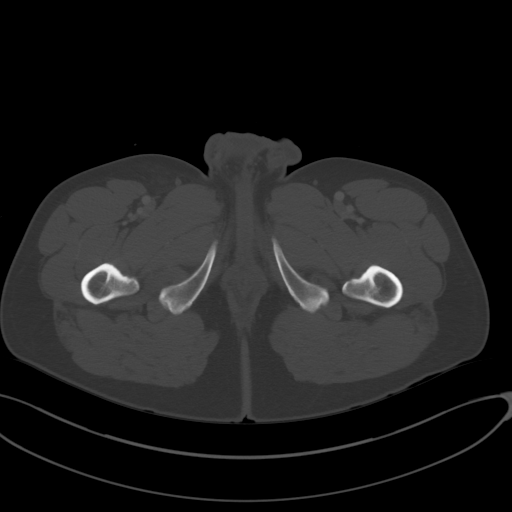
[im 15/99  soft-tissue]
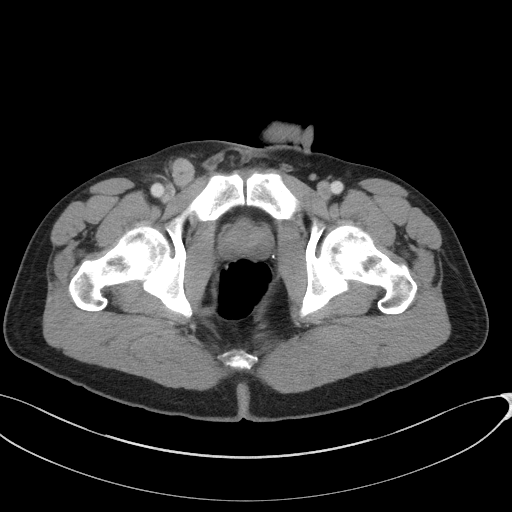
[im 25/99  soft-tissue]
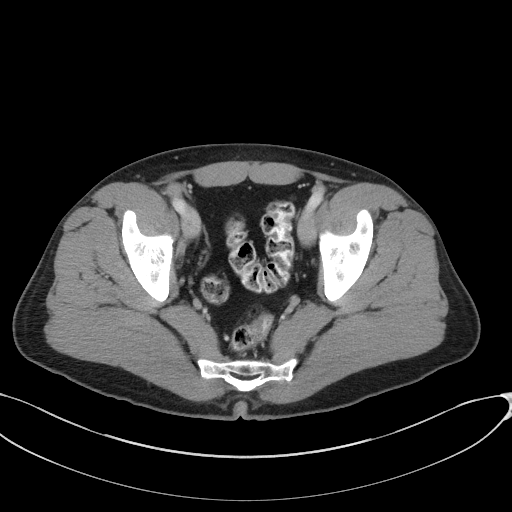
[im 35/99  soft-tissue]
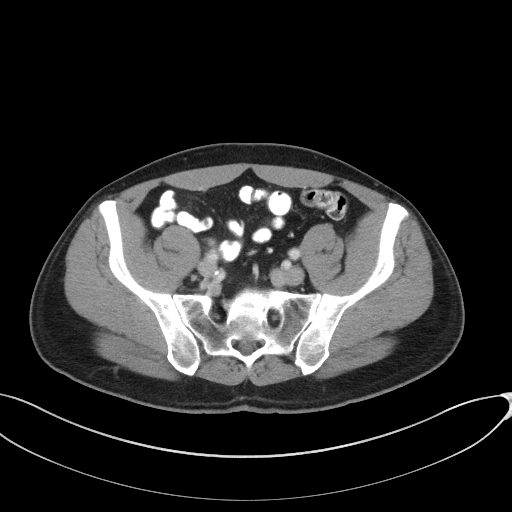
[im 40/99  soft-tissue]
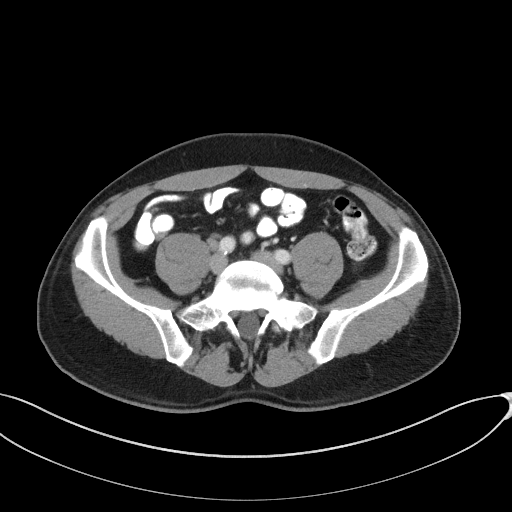
[im 50/99  soft-tissue]
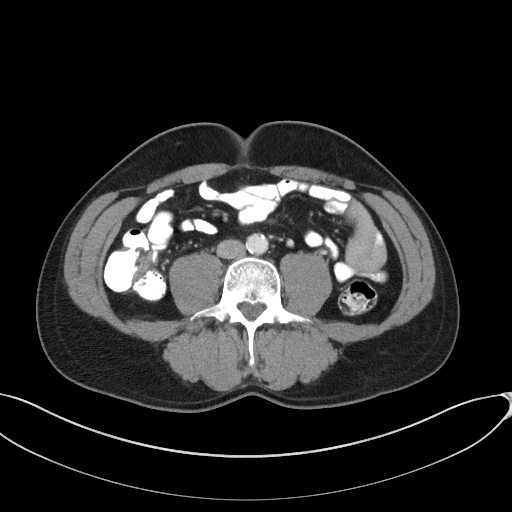
[im 59/99  soft-tissue]
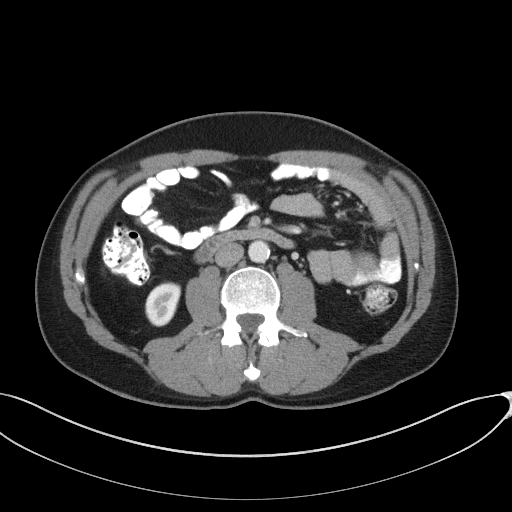
[im 64/99  soft-tissue]
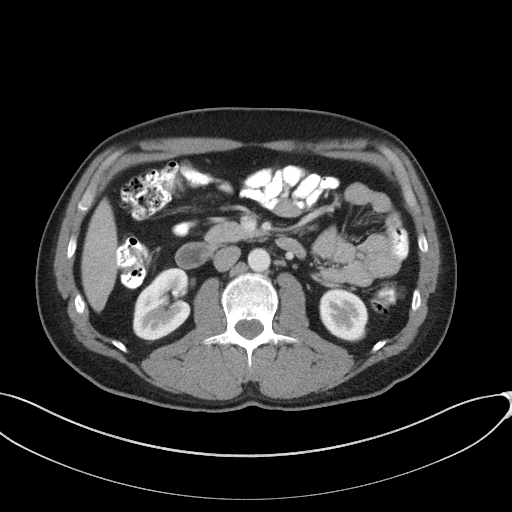
[im 74/99  soft-tissue]
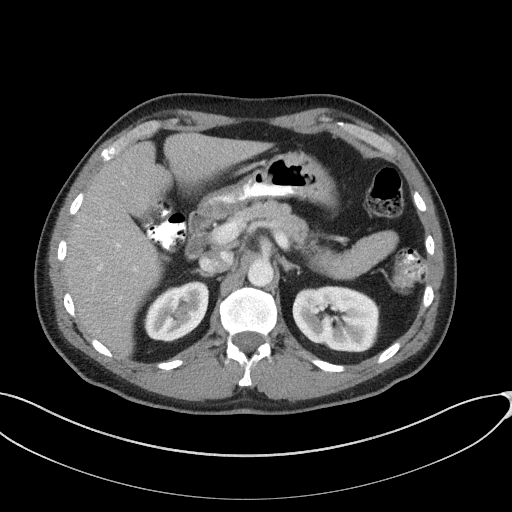
[im 74/99  bone]
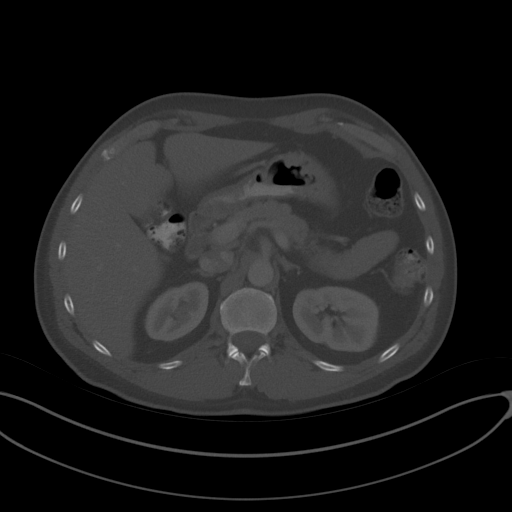
[im 84/99  soft-tissue]
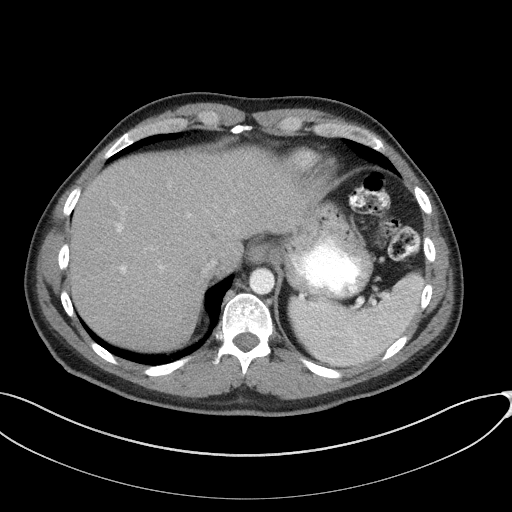
[im 94/99  soft-tissue]
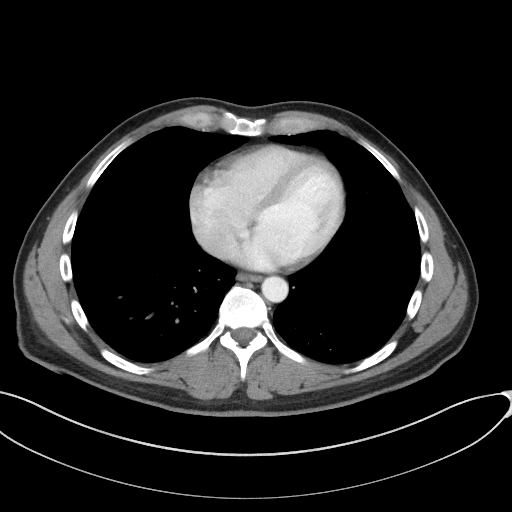

[Series 5: a/p w/ cor · coronal · 0.74mm/px · 3 of 119 slices shown]
[im 40/119  soft-tissue]
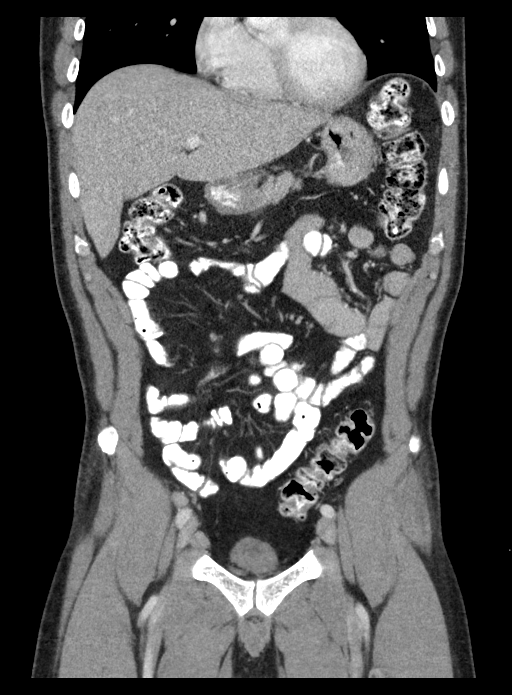
[im 53/119  soft-tissue]
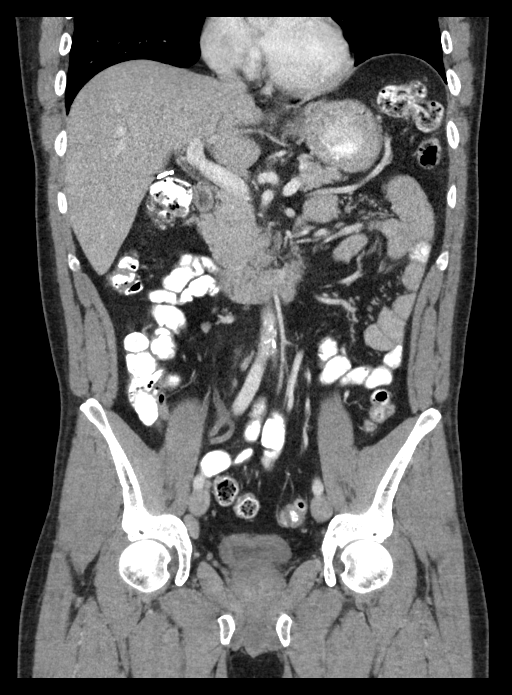
[im 66/119  soft-tissue]
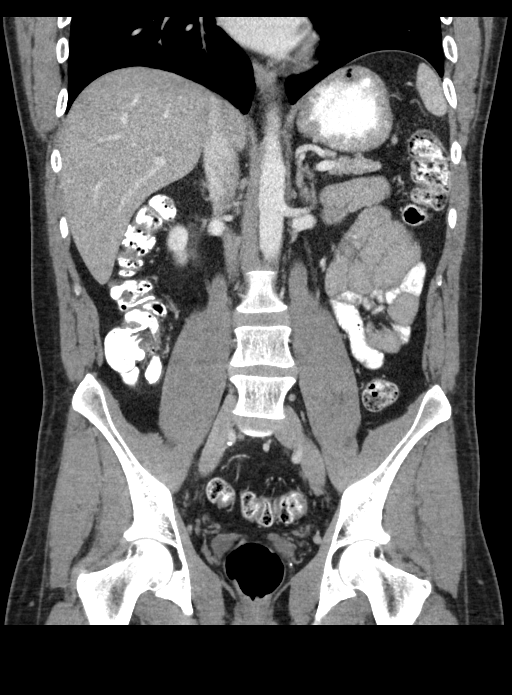

[14 of 46 positions shown; findings below may reference images not displayed]

FINDINGS: Lower chest:  Lung bases are clear.

Hepatobiliary: No focal liver lesions are identified. The
gallbladder is absent. There is no biliary duct dilatation.

Pancreas: There is no demonstrable pancreatic mass or inflammatory
focus.

Spleen: No splenic lesions are identified.

Adrenals/Urinary Tract: No adrenal lesions are identified. There is
scarring in the upper pole of the left kidney. There is no renal
mass or hydronephrosis on either side. There is no renal or ureteral
calculus on either side. The urinary bladder is contracted.

Stomach/Bowel: There is no bowel wall or mesenteric thickening. No
bowel obstruction. No free air or portal venous air.

Vascular/Lymphatic: There are enlarged lymph nodes in the right
inguinal region. There is a right inguinal lymph node measuring
x 1.9 cm. A slightly more medial lymph node in the right inguinal
region measures 2.5 x 2.1 cm. There is stranding in the immediately
surrounding abdominal wall fat in the right anterior inguinal region
without similar stranding elsewhere. There are subcentimeter lymph
nodes elsewhere in each inguinal region. Elsewhere, there is no
appreciable adenopathy in the abdomen or pelvis. There are a few
tiny mesenteric retroperitoneal and mesenteric lymph nodes which do
not meet size criteria for pathologic significance. There is
atherosclerotic calcification in the distal aorta and common iliac
arteries. There is no abdominal aortic aneurysm. The major
mesenteric arteries are patent.

Reproductive: Prostate and seminal vesicles appear normal.

Other: Appendix appears normal. There is no ascites or abscess in
the abdomen or pelvis. No inguinal hernias are apparent. There is a
rather minimal ventral hernia at the level of the umbilicus
containing only fat.

Musculoskeletal: There is mild degenerative change in the lumbar
spine. There are no blastic or lytic bone lesions.
IMPRESSION: There are two enlarged lymph nodes in the right inguinal region.
There are scattered subcentimeter lymph nodes elsewhere in each
inguinal region as well as in the mesenteric and retroperitoneal
regions. No other lymph node enlargement is appreciable in the
abdomen or pelvis. The etiology for this localized right inguinal
adenopathy is uncertain. There is mild thickening of the fat
adjacent to the inguinal lymph nodes in the right inguinal wall
anteriorly. Both inflammatory and neoplastic etiologies could
present in this manner.

There is a rather minimal ventral hernia containing only fat.

Study otherwise unremarkable. No liver lesions. Urinary bladder is
contracted. This degree of contraction precludes exclusion of
potential wall thickness in this area.

There is no bowel wall or mesenteric thickening within the
peritoneal or retroperitoneum. No bowel obstruction. Appendix
appears normal. No abscess. No renal or ureteral calculus. No
hydronephrosis.

## 2017-11-04 DIAGNOSIS — R5383 Other fatigue: Secondary | ICD-10-CM | POA: Diagnosis not present

## 2017-11-04 DIAGNOSIS — Z0000 Encounter for general adult medical examination without abnormal findings: Secondary | ICD-10-CM | POA: Diagnosis not present

## 2017-11-07 DIAGNOSIS — Z0000 Encounter for general adult medical examination without abnormal findings: Secondary | ICD-10-CM | POA: Diagnosis not present

## 2017-11-07 DIAGNOSIS — Z125 Encounter for screening for malignant neoplasm of prostate: Secondary | ICD-10-CM | POA: Diagnosis not present

## 2017-11-07 DIAGNOSIS — R5383 Other fatigue: Secondary | ICD-10-CM | POA: Diagnosis not present

## 2017-11-07 DIAGNOSIS — N400 Enlarged prostate without lower urinary tract symptoms: Secondary | ICD-10-CM | POA: Diagnosis not present

## 2017-11-07 DIAGNOSIS — Z13220 Encounter for screening for lipoid disorders: Secondary | ICD-10-CM | POA: Diagnosis not present

## 2019-02-18 ENCOUNTER — Emergency Department: Payer: 59

## 2019-02-18 ENCOUNTER — Other Ambulatory Visit: Payer: Self-pay

## 2019-02-18 ENCOUNTER — Emergency Department
Admission: EM | Admit: 2019-02-18 | Discharge: 2019-02-18 | Disposition: A | Discharge status: Home / Self Care | Payer: 59 | Attending: Emergency Medicine | Admitting: Emergency Medicine

## 2019-02-18 ENCOUNTER — Encounter: Payer: Self-pay | Admitting: Emergency Medicine

## 2019-02-18 DIAGNOSIS — U071 COVID-19: Secondary | ICD-10-CM | POA: Diagnosis not present

## 2019-02-18 DIAGNOSIS — Z79899 Other long term (current) drug therapy: Secondary | ICD-10-CM | POA: Insufficient documentation

## 2019-02-18 DIAGNOSIS — R079 Chest pain, unspecified: Secondary | ICD-10-CM | POA: Diagnosis not present

## 2019-02-18 DIAGNOSIS — Z87891 Personal history of nicotine dependence: Secondary | ICD-10-CM | POA: Insufficient documentation

## 2019-02-18 DIAGNOSIS — R509 Fever, unspecified: Secondary | ICD-10-CM | POA: Diagnosis present

## 2019-02-18 LAB — COMPREHENSIVE METABOLIC PANEL
ALT: 51 U/L — ABNORMAL HIGH (ref 0–44)
AST: 52 U/L — ABNORMAL HIGH (ref 15–41)
Albumin: 3.5 g/dL (ref 3.5–5.0)
Alkaline Phosphatase: 45 U/L (ref 38–126)
Anion gap: 11 (ref 5–15)
BUN: 12 mg/dL (ref 6–20)
CO2: 26 mmol/L (ref 22–32)
Calcium: 8.3 mg/dL — ABNORMAL LOW (ref 8.9–10.3)
Chloride: 101 mmol/L (ref 98–111)
Creatinine, Ser: 1.09 mg/dL (ref 0.61–1.24)
GFR calc Af Amer: >60 mL/min (ref >60)
GFR calc non Af Amer: >60 mL/min (ref >60)
Glucose, Bld: 101 mg/dL — ABNORMAL HIGH (ref 70–99)
Potassium: 3.2 mmol/L — ABNORMAL LOW (ref 3.5–5.1)
Sodium: 138 mmol/L (ref 135–145)
Total Bilirubin: 0.3 mg/dL (ref 0.3–1.2)
Total Protein: 7 g/dL (ref 6.5–8.1)

## 2019-02-18 LAB — CBC
HCT: 44.3 % (ref 39.0–52.0)
Hemoglobin: 14.5 g/dL (ref 13.0–17.0)
MCH: 26.9 pg (ref 26.0–34.0)
MCHC: 32.7 g/dL (ref 30.0–36.0)
MCV: 82 fL (ref 80.0–100.0)
Platelets: 198 10*3/uL (ref 150–400)
RBC: 5.4 MIL/uL (ref 4.22–5.81)
RDW: 14.1 % (ref 11.5–15.5)
WBC: 4.7 10*3/uL (ref 4.0–10.5)
nRBC: 0 % (ref 0.0–0.2)

## 2019-02-18 LAB — TROPONIN I (HIGH SENSITIVITY): Troponin I (High Sensitivity): 4 ng/L (ref <18)

## 2019-02-18 MED ORDER — AZITHROMYCIN 250 MG PO TABS: ORAL_TABLET | ORAL | 0 refills | Status: AC

## 2019-02-18 MED ORDER — PREDNISONE 10 MG (21) PO TBPK: ORAL_TABLET | ORAL | 0 refills | Status: AC

## 2019-02-18 NOTE — Discharge Instructions (Signed)
Follow-up with your regular doctor if not improving in 3 to 4 days.  Return emergency department worsening.  Continue take Tylenol or ibuprofen for fever as needed.  Over-the-counter Mucinex to help decrease the amount of mucus in your chest.  Take the Z-Pak and steroid pack.  It is important to take these to prevent a secondary pneumonia and decrease inflammation in your chest.  You may return to work once you have all 3 of the following: No fever in 24 to 48 hours, symptoms started at least 10 days prior, and your symptoms improved

## 2019-02-18 NOTE — ED Notes (Signed)
Pt discharged home after verbalizing understanding of discharge instructions; nad noted. 

## 2019-02-18 NOTE — ED Triage Notes (Signed)
Patient states that he was diagnosed with covid a week ago. Patient states that he continues to run a fever. Patient states that he also has some chest pain with taking deep breaths.

## 2019-02-18 NOTE — ED Notes (Signed)
Pt diagnosed with covid 1 week ago, states his lungs are "raw" and wants to make sure he does not have pneumonia. Pt has had fever every day since diagnosis. Pt has been taking ibuprofen and tylenol for fever; has had no cough, so hasn't taken anything for it.

## 2019-02-18 NOTE — ED Provider Notes (Signed)
[facility] Emergency Department Provider Note  ____________________________________________   First MD Initiated Contact with Patient 02/18/19 2054     (approximate)  I have reviewed the triage vital signs and the nursing notes.   HISTORY  Chief Complaint Chest Pain    HPI KEISTON MANLEY is a 49 y.o. male presents emergency department stating he tested positive for Covid about 7 days ago.  He states everyone else at his home is been getting better but he is not.  He is continue to run a fever.  States he is not short of breath but feels like his chest feels different.  He does not describe it as cardiac type chest pain.  He states he is just concerned as he is continue to stay sick while everyone else has gotten better.  He denies any vomiting or diarrhea.    Past Medical History:  Diagnosis Date  . Barrett esophagus   . BPH (benign prostatic hyperplasia)   . GERD (gastroesophageal reflux disease)   . Mitral valve prolapse   . Nocturia   . Urinary frequency     Patient Active Problem List   Diagnosis Date Noted  . Lymphadenopathy 01/14/2015  . Groin mass 01/08/2015    Past Surgical History:  Procedure Laterality Date  . CHOLECYSTECTOMY      Prior to Admission medications   Medication Sig Start Date End Date Taking? Authorizing Provider  azithromycin (ZITHROMAX Z-PAK) 250 MG tablet 2 pills today then 1 pill a day for 4 days 02/18/19   Sherrie Mustache Roselyn Bering, PA-C  lansoprazole (PREVACID) 30 MG capsule Take by mouth. 08/06/09   [provider]  predniSONE (STERAPRED UNI-PAK 21 TAB) 10 MG (21) TBPK tablet Take 6 pills on day one then decrease by 1 pill each day 02/18/19   Faythe Ghee, PA-C    Allergies Patient has no known allergies.  Family History  Problem Relation Age of Onset  . Deep vein thrombosis Sister   . Pulmonary embolism Sister   . Diabetes Mother   . Kidney disease Neg Hx   . Prostate cancer Neg Hx     Social  History Social History   Tobacco Use  . Smoking status: Former Games developer  . Smokeless tobacco: Former Engineer, water Use Topics  . Alcohol use: No    Alcohol/week: 0.0 standard drinks  . Drug use: No    Review of Systems  Constitutional: Positive fever/chills Eyes: No visual changes. ENT: No sore throat. Respiratory: Positive cough Cardiovascular: Denies cardiac chest pain Gastrointestinal: Denies abdominal pain Genitourinary: Negative for dysuria. Musculoskeletal: Negative for back pain. Skin: Negative for rash. Psychiatric: no mood changes,     ____________________________________________   PHYSICAL EXAM:  VITAL SIGNS: ED Triage Vitals  Enc Vitals Group     BP 02/18/19 1927 125/72     Pulse Rate 02/18/19 1927 84     Resp 02/18/19 1927 18     Temp 02/18/19 1927 98.8 F (37.1 C)     Temp Source 02/18/19 1927 Oral     SpO2 02/18/19 1927 95 %     Weight 02/18/19 1928 199 lb (90.3 kg)     Height 02/18/19 1928 6\' 1"  (1.854 m)     Head Circumference --      Peak Flow --      Pain Score 02/18/19 1928 2     Pain Loc --      Pain Edu? --      Excl. in  GC? --     Constitutional: Alert and oriented. Well appearing and in no acute distress. Eyes: Conjunctivae are normal.  Head: Atraumatic. Nose: No congestion/rhinnorhea. Mouth/Throat: Mucous membranes are moist.   Neck:  supple no lymphadenopathy noted Cardiovascular: Normal rate, regular rhythm. Heart sounds are normal Respiratory: Normal respiratory effort.  No retractions, lungs c t a  GU: deferred Musculoskeletal: FROM all extremities, warm and well perfused Neurologic:  Normal speech and language.  Skin:  Skin is warm, dry and intact. No rash noted. Psychiatric: Mood and affect are normal. Speech and behavior are normal.  ____________________________________________   LABS (all labs ordered are listed, but only abnormal results are displayed)  Labs Reviewed  COMPREHENSIVE METABOLIC PANEL - Abnormal;  Notable for the following components:      Result Value   Potassium 3.2 (*)    Glucose, Bld 101 (*)    Calcium 8.3 (*)    AST 52 (*)    ALT 51 (*)    All other components within normal limits  CBC  TROPONIN I (HIGH SENSITIVITY)  TROPONIN I (HIGH SENSITIVITY)   ____________________________________________   ____________________________________________  RADIOLOGY  Chest x-ray is normal  ____________________________________________   PROCEDURES  Procedure(s) performed: No  Procedures    ____________________________________________   INITIAL IMPRESSION / ASSESSMENT AND PLAN / ED COURSE  Pertinent labs & imaging results that were available during my care of the patient were reviewed by me and considered in my medical decision making (see chart for details).   Patient is 49 year old male with no significant past medical history who is concerned that he continues to have Covid symptoms.  Tested +1-week ago.  See HPI  Physical exam patient appears well.  He is stable.  Vitals are normal.  Exam is unremarkable  DDx: Continued Covid, pneumonia, nonspecific chest pain  CBC is normal, comprehensive metabolic panel is basically normal with mild elevation of AST and ALT, slight decreased potassium at 3.2, troponin is normal  Chest x-ray is normal  Explained the findings to the patient.  He looks very well and stable.  Explained to him that with the discomfort we can start him on a Z-Pak and steroid pack to help prevent a secondary pneumonia along with decreasing the inflammation.  He is to follow-up with his regular doctor if not better in 3 days.  We discussed his return to work.  I gave him the criteria as stated by the [facility] and [employer].  He is to return to the emergency department if worsening.     DAMONTA COSSEY was evaluated in Emergency Department on 02/18/2019 for the symptoms described in the history of present illness. He was  evaluated in the context of the global COVID-19 pandemic, which necessitated consideration that the patient might be at risk for infection with the SARS-CoV-2 virus that causes COVID-19. Institutional protocols and algorithms that pertain to the evaluation of patients at risk for COVID-19 are in a state of rapid change based on information released by regulatory bodies including the CDC and federal and state organizations. These policies and algorithms were followed during the patient's care in the ED.   As part of my medical decision making, I reviewed the following data within the electronic MEDICAL RECORD NUMBER Nursing notes reviewed and incorporated, Labs reviewed see above, EKG interpreted NSR, Old chart reviewed, Radiograph reviewed chest x-ray normal, Notes from prior ED visits and [place] Controlled Substance Database  ____________________________________________   FINAL CLINICAL IMPRESSION(S) /  ED DIAGNOSES  Final diagnoses:  COVID-19      NEW MEDICATIONS STARTED DURING THIS VISIT:  Discharge Medication List as of 02/18/2019  9:11 PM    START taking these medications   Details  azithromycin (ZITHROMAX Z-PAK) 250 MG tablet 2 pills today then 1 pill a day for 4 days, Normal    predniSONE (STERAPRED UNI-PAK 21 TAB) 10 MG (21) TBPK tablet Take 6 pills on day one then decrease by 1 pill each day, Normal         Note:  This document was prepared using Dragon voice recognition software and may include unintentional dictation errors.    Versie Starks, PA-C 02/18/19 2133    Lavonia Drafts, MD 02/18/19 2202

## 2020-04-11 IMAGING — CR DG CHEST 2V
1 series · 2 of 2 positions shown · non-contrast
Comparison: June 08, 2006

CLINICAL DATA: Chest pain. Patient diagnosed with [id number] a week
ago. Fever.

EXAM:
CHEST - 2 VIEW

[Series 1: w chest pa · 0.14mm/px · 2 of 2 slices shown]
[im 1/2]
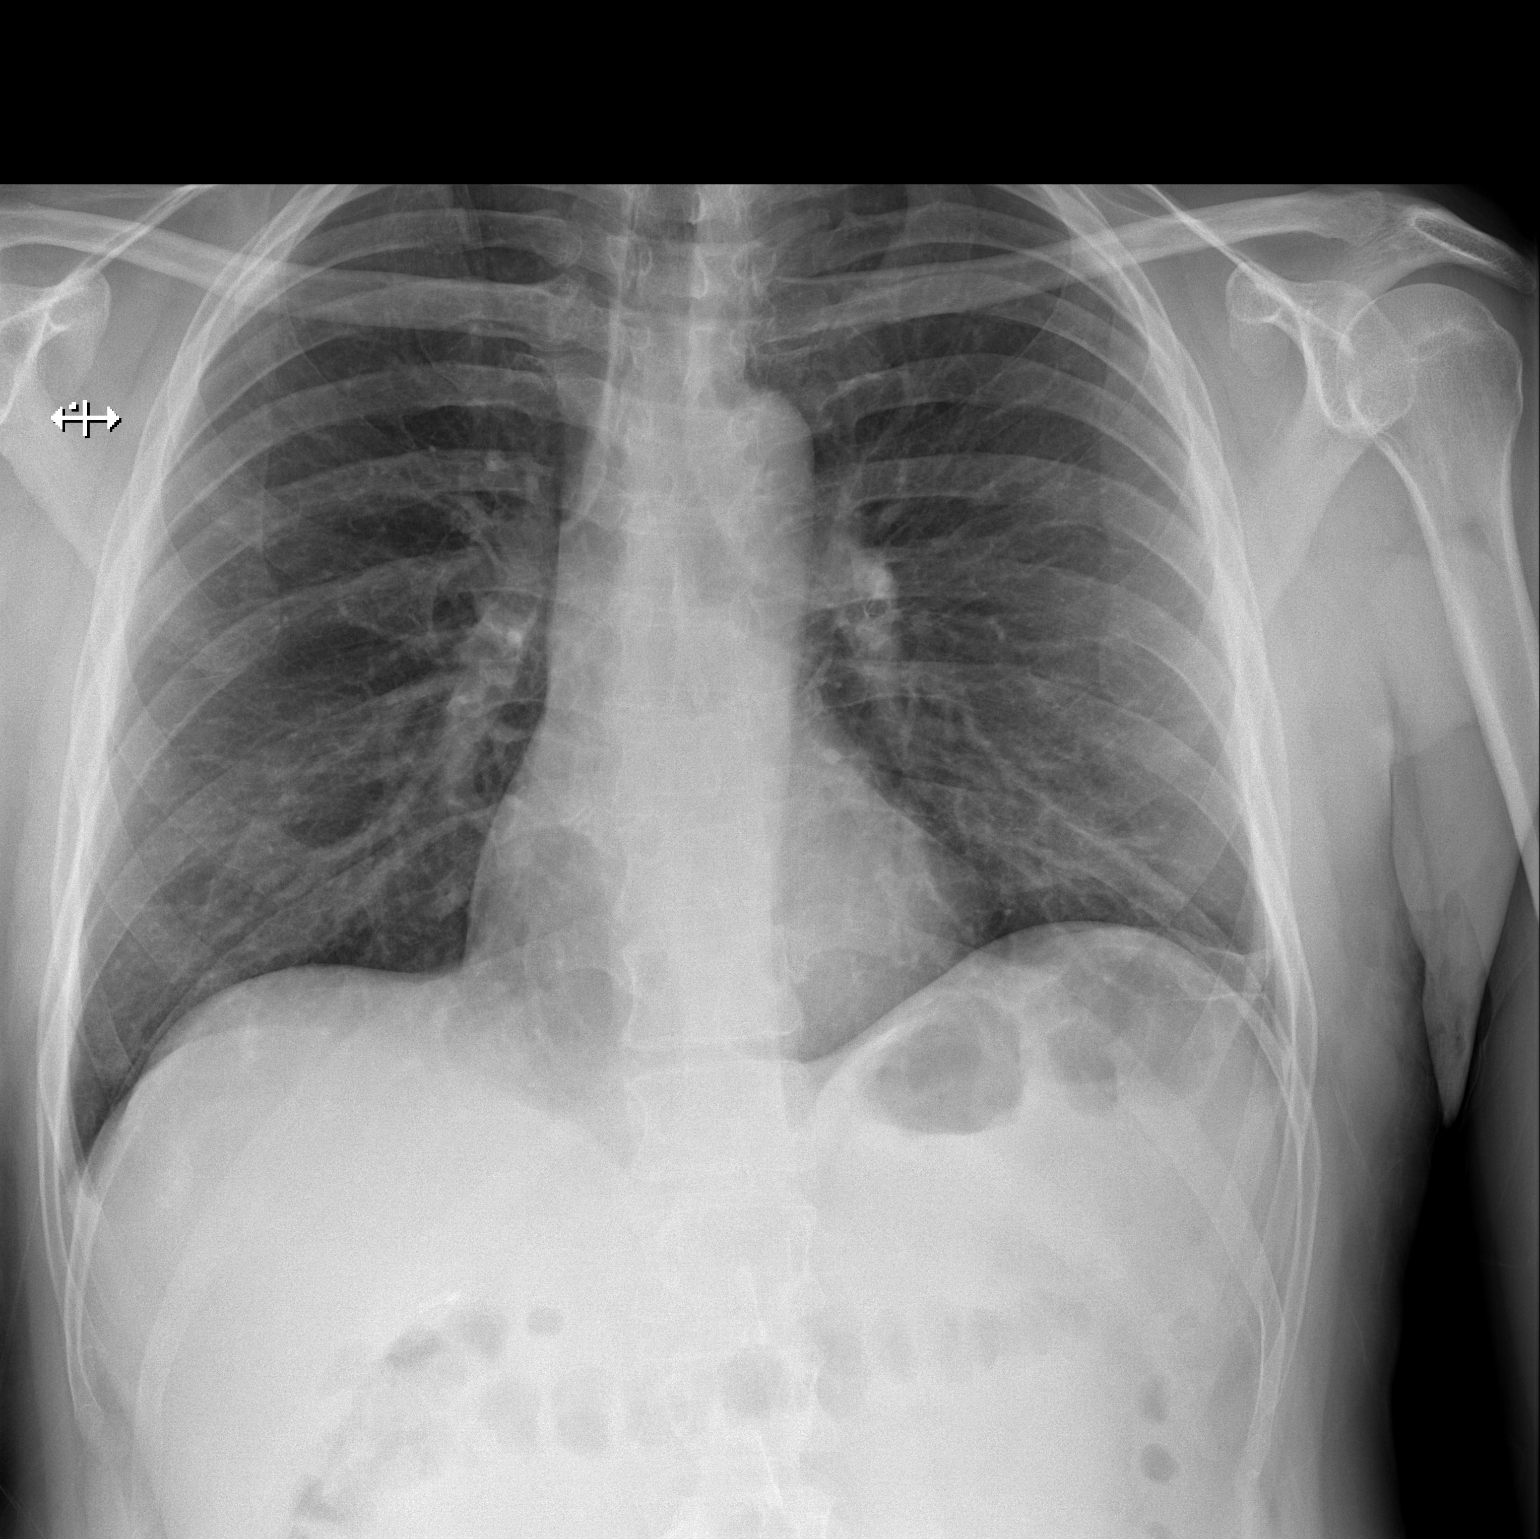
[im 2/2]
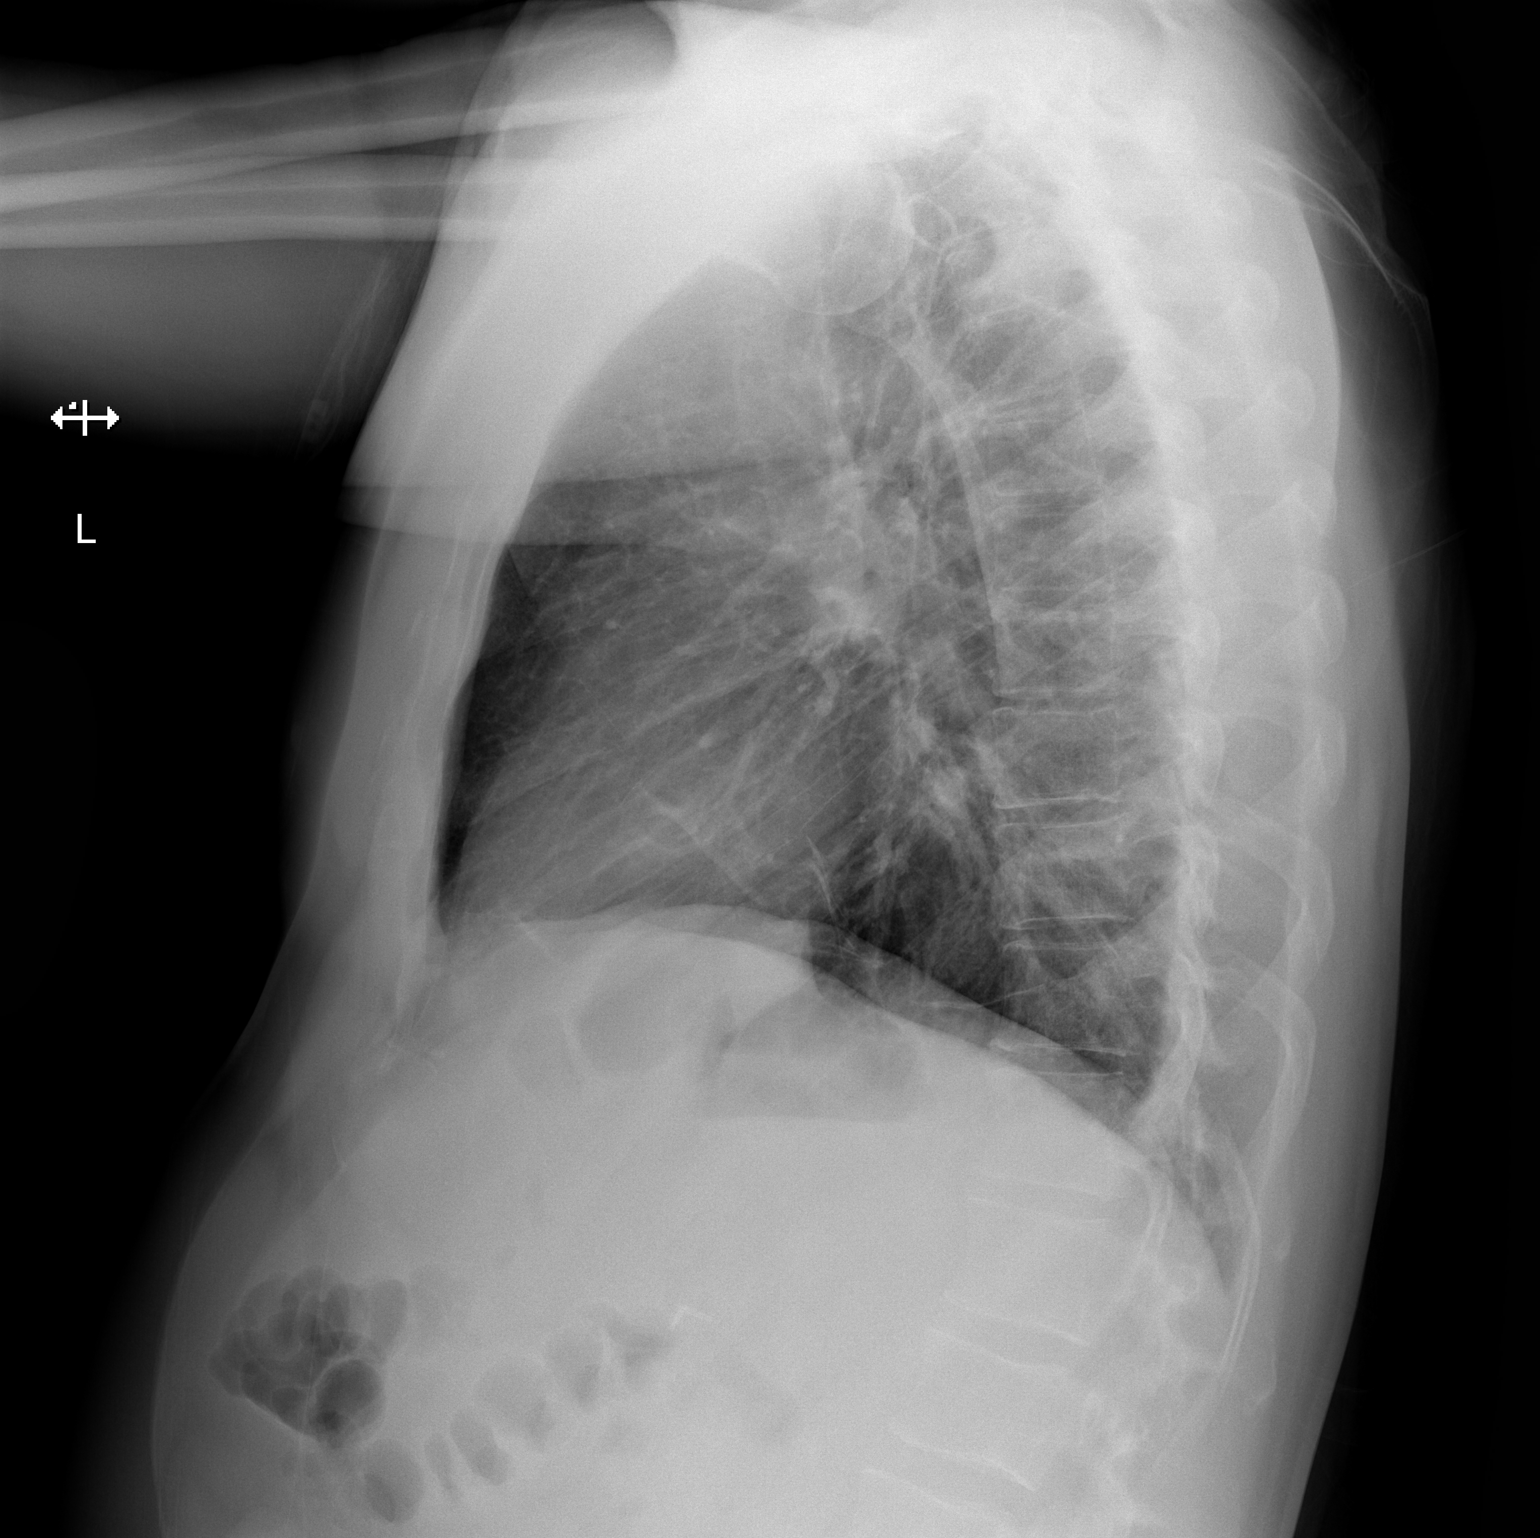

[2 of 2 positions shown; findings below may reference images not displayed]

FINDINGS: Atelectasis in the lateral left lung base. The heart, hila,
mediastinum, lungs, and pleura are otherwise normal.
IMPRESSION: No active cardiopulmonary disease.
